# Patient Record
Sex: Male | Born: 1970 | Race: White | Hispanic: No | Marital: Married | Smoking: Never smoker
Health system: Southern US, Community
[De-identification: ages and names within clinical notes are randomized; demographics above are authoritative.]

## PROBLEM LIST (undated history)

## (undated) DIAGNOSIS — K219 Gastro-esophageal reflux disease without esophagitis: Secondary | ICD-10-CM

## (undated) DIAGNOSIS — E782 Mixed hyperlipidemia: Secondary | ICD-10-CM

## (undated) DIAGNOSIS — G4733 Obstructive sleep apnea (adult) (pediatric): Secondary | ICD-10-CM

## (undated) DIAGNOSIS — I10 Essential (primary) hypertension: Secondary | ICD-10-CM

## (undated) DIAGNOSIS — Z800 Family history of malignant neoplasm of digestive organs: Secondary | ICD-10-CM

## (undated) DIAGNOSIS — D229 Melanocytic nevi, unspecified: Secondary | ICD-10-CM

## (undated) DIAGNOSIS — K635 Polyp of colon: Secondary | ICD-10-CM

## (undated) DIAGNOSIS — G4730 Sleep apnea, unspecified: Secondary | ICD-10-CM

## (undated) HISTORY — DX: Sleep apnea, unspecified: G47.30

## (undated) HISTORY — PX: WISDOM TOOTH EXTRACTION: SHX21

---
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
## 2007-04-17 DIAGNOSIS — D229 Melanocytic nevi, unspecified: Secondary | ICD-10-CM

## 2007-04-17 HISTORY — DX: Melanocytic nevi, unspecified: D22.9

## 2018-06-27 DIAGNOSIS — Z800 Family history of malignant neoplasm of digestive organs: Secondary | ICD-10-CM | POA: Diagnosis not present

## 2018-06-27 DIAGNOSIS — E7800 Pure hypercholesterolemia, unspecified: Secondary | ICD-10-CM | POA: Diagnosis not present

## 2018-06-27 DIAGNOSIS — Z1331 Encounter for screening for depression: Secondary | ICD-10-CM | POA: Diagnosis not present

## 2018-06-27 DIAGNOSIS — I10 Essential (primary) hypertension: Secondary | ICD-10-CM | POA: Diagnosis not present

## 2018-09-19 DIAGNOSIS — D485 Neoplasm of uncertain behavior of skin: Secondary | ICD-10-CM | POA: Diagnosis not present

## 2018-09-19 DIAGNOSIS — L910 Hypertrophic scar: Secondary | ICD-10-CM | POA: Diagnosis not present

## 2018-09-19 DIAGNOSIS — D229 Melanocytic nevi, unspecified: Secondary | ICD-10-CM | POA: Diagnosis not present

## 2018-11-04 ENCOUNTER — Telehealth: Payer: Self-pay | Admitting: Gastroenterology

## 2018-11-04 NOTE — Telephone Encounter (Signed)
Dr. Tarri Glenn, pt requested you as a GI MD.  Pt's previous colon and path reports from 2013 will be sent to you for review.  Please advise scheduling.

## 2018-12-01 NOTE — Telephone Encounter (Signed)
Please check on status of records.  Pt requested an update.

## 2018-12-02 NOTE — Telephone Encounter (Signed)
Ok to schedule looks like records were scanned into chart under media. Thanks!

## 2018-12-16 ENCOUNTER — Other Ambulatory Visit: Payer: Self-pay

## 2018-12-16 ENCOUNTER — Other Ambulatory Visit: Payer: Self-pay | Admitting: *Deleted

## 2018-12-16 ENCOUNTER — Encounter: Payer: Self-pay | Admitting: Family Medicine

## 2018-12-16 ENCOUNTER — Ambulatory Visit (INDEPENDENT_AMBULATORY_CARE_PROVIDER_SITE_OTHER): Payer: BC Managed Care – PPO | Admitting: Family Medicine

## 2018-12-16 VITALS — BP 138/82 | HR 80 | Temp 97.6°F | Resp 16 | Ht 71.0 in | Wt 260.8 lb

## 2018-12-16 DIAGNOSIS — Z23 Encounter for immunization: Secondary | ICD-10-CM

## 2018-12-16 DIAGNOSIS — Z800 Family history of malignant neoplasm of digestive organs: Secondary | ICD-10-CM | POA: Diagnosis not present

## 2018-12-16 DIAGNOSIS — Z9989 Dependence on other enabling machines and devices: Secondary | ICD-10-CM

## 2018-12-16 DIAGNOSIS — E782 Mixed hyperlipidemia: Secondary | ICD-10-CM

## 2018-12-16 DIAGNOSIS — D369 Benign neoplasm, unspecified site: Secondary | ICD-10-CM | POA: Insufficient documentation

## 2018-12-16 DIAGNOSIS — E669 Obesity, unspecified: Secondary | ICD-10-CM

## 2018-12-16 DIAGNOSIS — K635 Polyp of colon: Secondary | ICD-10-CM

## 2018-12-16 DIAGNOSIS — K219 Gastro-esophageal reflux disease without esophagitis: Secondary | ICD-10-CM | POA: Insufficient documentation

## 2018-12-16 DIAGNOSIS — N529 Male erectile dysfunction, unspecified: Secondary | ICD-10-CM | POA: Insufficient documentation

## 2018-12-16 DIAGNOSIS — I10 Essential (primary) hypertension: Secondary | ICD-10-CM

## 2018-12-16 DIAGNOSIS — G4733 Obstructive sleep apnea (adult) (pediatric): Secondary | ICD-10-CM | POA: Diagnosis not present

## 2018-12-16 HISTORY — DX: Essential (primary) hypertension: I10

## 2018-12-16 HISTORY — DX: Obstructive sleep apnea (adult) (pediatric): G47.33

## 2018-12-16 HISTORY — DX: Family history of malignant neoplasm of digestive organs: Z80.0

## 2018-12-16 HISTORY — DX: Polyp of colon: K63.5

## 2018-12-16 HISTORY — DX: Mixed hyperlipidemia: E78.2

## 2018-12-16 HISTORY — DX: Gastro-esophageal reflux disease without esophagitis: K21.9

## 2018-12-16 NOTE — Patient Instructions (Signed)
Please return in 6 months for follow up of your hypertension and for your annual complete physical; please come fasting.  Today you were given your flu vaccination.   It was a pleasure meeting you today! Thank you for choosing [place] to meet your healthcare needs! I truly look forward to working with you. If you have any questions or concerns, please send me a message via Mychart or call the office at [phone].   Hypertension, Adult High blood pressure (hypertension) is when the force of blood pumping through the arteries is too strong. The arteries are the blood vessels that carry blood from the heart throughout the body. Hypertension forces the heart to work harder to pump blood and may cause arteries to become narrow or stiff. Untreated or uncontrolled hypertension can cause a heart attack, heart failure, a stroke, kidney disease, and other problems. A blood pressure reading consists of a higher number over a lower number. Ideally, your blood pressure should be below 120/80. The first ("top") number is called the systolic pressure. It is a measure of the pressure in your arteries as your heart beats. The second ("bottom") number is called the diastolic pressure. It is a measure of the pressure in your arteries as the heart relaxes. What are the causes? The exact cause of this condition is not known. There are some conditions that result in or are related to high blood pressure. What increases the risk? Some risk factors for high blood pressure are under your control. The following factors may make you more likely to develop this condition:  Smoking.  Having type 2 diabetes mellitus, high cholesterol, or both.  Not getting enough exercise or physical activity.  Being overweight.  Having too much fat, sugar, calories, or salt (sodium) in your diet.  Drinking too much alcohol. Some risk factors for high blood pressure may be difficult or impossible to change. Some of these factors include:   Having chronic kidney disease.  Having a family history of high blood pressure.  Age. Risk increases with age.  Race. You may be at higher risk if you are African American.  Gender. Men are at higher risk than women before age 63. After age 62, women are at higher risk than men.  Having obstructive sleep apnea.  Stress. What are the signs or symptoms? High blood pressure may not cause symptoms. Very high blood pressure (hypertensive crisis) may cause:  Headache.  Anxiety.  Shortness of breath.  Nosebleed.  Nausea and vomiting.  Vision changes.  Severe chest pain.  Seizures. How is this diagnosed? This condition is diagnosed by measuring your blood pressure while you are seated, with your arm resting on a flat surface, your legs uncrossed, and your feet flat on the floor. The cuff of the blood pressure monitor will be placed directly against the skin of your upper arm at the level of your heart. It should be measured at least twice using the same arm. Certain conditions can cause a difference in blood pressure between your right and left arms. Certain factors can cause blood pressure readings to be lower or higher than normal for a short period of time:  When your blood pressure is higher when you are in a health care provider's office than when you are at home, this is called white coat hypertension. Most people with this condition do not need medicines.  When your blood pressure is higher at home than when you are in a health care provider's office, this is called  masked hypertension. Most people with this condition may need medicines to control blood pressure. If you have a high blood pressure reading during one visit or you have normal blood pressure with other risk factors, you may be asked to:  Return on a different day to have your blood pressure checked again.  Monitor your blood pressure at home for 1 week or longer. If you are diagnosed with hypertension, you  may have other blood or imaging tests to help your health care provider understand your overall risk for other conditions. How is this treated? This condition is treated by making healthy lifestyle changes, such as eating healthy foods, exercising more, and reducing your alcohol intake. Your health care provider may prescribe medicine if lifestyle changes are not enough to get your blood pressure under control, and if:  Your systolic blood pressure is above 130.  Your diastolic blood pressure is above 80. Your personal target blood pressure may vary depending on your medical conditions, your age, and other factors. Follow these instructions at home: Eating and drinking   Eat a diet that is high in fiber and potassium, and low in sodium, added sugar, and fat. An example eating plan is called the DASH (Dietary Approaches to Stop Hypertension) diet. To eat this way: ? Eat plenty of fresh fruits and vegetables. Try to fill one half of your plate at each meal with fruits and vegetables. ? Eat whole grains, such as whole-wheat pasta, brown rice, or whole-grain bread. Fill about one fourth of your plate with whole grains. ? Eat or drink low-fat dairy products, such as skim milk or low-fat yogurt. ? Avoid fatty cuts of meat, processed or cured meats, and poultry with skin. Fill about one fourth of your plate with lean proteins, such as fish, chicken without skin, beans, eggs, or tofu. ? Avoid pre-made and processed foods. These tend to be higher in sodium, added sugar, and fat.  Reduce your daily sodium intake. Most people with hypertension should eat less than 1,500 mg of sodium a day.  Do not drink alcohol if: ? Your health care provider tells you not to drink. ? You are pregnant, may be pregnant, or are planning to become pregnant.  If you drink alcohol: ? Limit how much you use to:  0-1 drink a day for women.  0-2 drinks a day for men. ? Be aware of how much alcohol is in your drink. In  the [place] one drink equals one 12 oz bottle of beer (355 mL), one 5 oz glass of wine (148 mL), or one 1 oz glass of hard liquor (44 mL). Lifestyle   Work with your health care provider to maintain a healthy body weight or to lose weight. Ask what an ideal weight is for you.  Get at least 30 minutes of exercise most days of the week. Activities may include walking, swimming, or biking.  Include exercise to strengthen your muscles (resistance exercise), such as Pilates or lifting weights, as part of your weekly exercise routine. Try to do these types of exercises for 30 minutes at least 3 days a week.  Do not use any products that contain nicotine or tobacco, such as cigarettes, e-cigarettes, and chewing tobacco. If you need help quitting, ask your health care provider.  Monitor your blood pressure at home as told by your health care provider.  Keep all follow-up visits as told by your health care provider. This is important. Medicines  Take over-the-counter and prescription medicines only as  told by your health care provider. Follow directions carefully. Blood pressure medicines must be taken as prescribed.  Do not skip doses of blood pressure medicine. Doing this puts you at risk for problems and can make the medicine less effective.  Ask your health care provider about side effects or reactions to medicines that you should watch for. Contact a health care provider if you:  Think you are having a reaction to a medicine you are taking.  Have headaches that keep coming back (recurring).  Feel dizzy.  Have swelling in your ankles.  Have trouble with your vision. Get help right away if you:  Develop a severe headache or confusion.  Have unusual weakness or numbness.  Feel faint.  Have severe pain in your chest or abdomen.  Vomit repeatedly.  Have trouble breathing. Summary  Hypertension is when the force of blood pumping through your arteries is too strong. If this  condition is not controlled, it may put you at risk for serious complications.  Your personal target blood pressure may vary depending on your medical conditions, your age, and other factors. For most people, a normal blood pressure is less than 120/80.  Hypertension is treated with lifestyle changes, medicines, or a combination of both. Lifestyle changes include losing weight, eating a healthy, low-sodium diet, exercising more, and limiting alcohol. This information is not intended to replace advice given to you by your health care provider. Make sure you discuss any questions you have with your health care provider. Document Released: 04/02/2005 Document Revised: 12/11/2017 Document Reviewed: 12/11/2017 Elsevier Patient Education  2020 [employer].

## 2018-12-16 NOTE — Progress Notes (Signed)
Subjective  CC:  Chief Complaint  Patient presents with  . Establish Care  . Hypertension  . Hyperlipidemia    HPI: Manuel Wagner is a 48 y.o. male who presents to [facility] at [facility] today to establish care with me as a new patient.   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 Liking it. Likes the pace here in [place]. Wife is in hotel business. No children. Happy. Working on staying healthy. Has HTN, HLD, GERD, OSA on CPAP and is working on weight loss. He enjoys carbs. Now eating at home almost always so trying to do better. Some exercise as well.   Last cpe march 2020 from [facility]; reports stable labs.   fam h/o colon cancer in father age 81. Due for next colonoscopy. [facility] appt to be set up.   Strong FH of HTN and HLD. Father had CAD/CABG in 25s.  No new concerns today.   Assessment  1. Essential hypertension   2. Family history of colon cancer   3. Mixed hyperlipidemia   4. OSA on CPAP   5. Obesity (BMI 30-39.9)      Plan   HTN / HLD controlled. Will monitor bp. Would prefer bp a little lower given strong FH of htn and CAD. Will adjust meds at next visit if not at goal. Get labs from [facility].   Colonoscopy to be scheduled.   Continue cpap.   Counseled on dietary changes that would be beneficial for him.   Flu shot today.   Follow up:  Return in about 6 months (around 06/15/2019) for complete physical, follow up Hypertension. No orders of the defined types were placed in this encounter.  No orders of the defined types were placed in this encounter.    Depression screen PHQ 2/9 12/16/2018  Decreased Interest 0  Down, Depressed, Hopeless 0  PHQ - 2 Score 0    We updated and reviewed the patient's past history in detail and  it is documented below.  Patient Active Problem List   Diagnosis Date Noted  . Family history of colon cancer 12/16/2018  . Colon polyp 12/16/2018    Colonoscopy 2013   . Mixed hyperlipidemia 12/16/2018  . Essential hypertension 12/16/2018  . ED (erectile dysfunction) 12/16/2018  . GERD (gastroesophageal reflux disease) 12/16/2018  . OSA on CPAP 12/16/2018    Since 2012   . Obesity (BMI 30-39.9) 12/16/2018   Health Maintenance  Topic Date Due  . HIV Screening  01/31/1986  . INFLUENZA VACCINE  11/15/2018  . TETANUS/TDAP  04/16/2026   Immunization History  Administered Date(s) Administered  . Influenza,inj,Quad PF,6+ Mos 02/16/2018   Current Meds  Medication Sig  . amLODipine (NORVASC) 5 MG tablet Take 1 tablet by mouth daily.  Marland Kitchen losartan (COZAAR) 100 MG tablet Take 1 tablet by mouth daily.  . pravastatin (PRAVACHOL) 10 MG tablet Take 1 tablet by mouth daily.  . sildenafil (VIAGRA) 50 MG tablet Take 50 mg by mouth daily as needed for erectile dysfunction.  . [DISCONTINUED] atorvastatin (LIPITOR) 10 MG tablet Take 1 tablet by mouth daily.    Allergies: Patient is allergic to lisinopril. Past Medical History Patient  has a past medical history of Atypical nevus (2009), Atypical nevus (2019), Colon polyp (12/16/2018),  Essential hypertension (12/16/2018), Family history of colon cancer (12/16/2018), GERD (gastroesophageal reflux disease) (12/16/2018), Mixed hyperlipidemia (12/16/2018), and OSA on CPAP (12/16/2018). Past Surgical History Patient  has no past surgical history on file. Family History: Patient family history includes Alcohol abuse in his brother, maternal grandfather, and paternal grandfather; Arthritis in his father; Colon cancer in his father; Hearing loss in his father; Heart disease in his father; Hyperlipidemia in his brother and mother; Hypertension in his brother and father. Social History:  Patient  reports that he has never smoked. He has never used smokeless tobacco.  He reports that he does not drink alcohol or use drugs.  Review of Systems: Constitutional: negative for fever or malaise Ophthalmic: negative for photophobia, double vision or loss of vision Cardiovascular: negative for chest pain, dyspnea on exertion, or new LE swelling Respiratory: negative for SOB or persistent cough Gastrointestinal: negative for abdominal pain, change in bowel habits or melena Genitourinary: negative for dysuria or gross hematuria Musculoskeletal: negative for new gait disturbance or muscular weakness Integumentary: negative for new or persistent rashes Neurological: negative for TIA or stroke symptoms Psychiatric: negative for SI or delusions Allergic/Immunologic: negative for hives  Patient Care Team    Relationship Specialty Notifications Start End  [name], MD PCP - General Family Medicine  12/16/18     Objective  Vitals: BP 138/82   Pulse 80   Temp 97.6 F (36.4 C) (Tympanic)   Resp 16   Ht 5\' 11"  (1.803 m)   Wt 260 lb 12.8 oz (118.3 kg)   SpO2 98%   BMI 36.37 kg/m  General:  Well developed, well nourished, no acute distress  Psych:  Alert and oriented,normal mood and affect HEENT:  Normocephalic, atraumatic, non-icteric sclera, PERRL, oropharynx is without mass or exudate, supple neck without adenopathy, mass or thyromegaly Cardiovascular:  RRR without gallop, rub or murmur, nondisplaced PMI Respiratory:  Good breath sounds bilaterally, CTAB with normal respiratory effort Gastrointestinal: normal bowel sounds, soft, non-tender, no noted masses. No HSM MSK: no deformities, contusions. Joints are without erythema or swelling Skin:  Warm, no rashes or suspicious lesions noted Neurologic:    Mental status is normal. Gross motor and sensory exams are normal. Normal gait   Commons side effects, risks, benefits, and alternatives for medications and treatment plan prescribed today were discussed, and the patient expressed understanding of the given  instructions. Patient is instructed to call or message via MyChart if he/she has any questions or concerns regarding our treatment plan. No barriers to understanding were identified. We discussed Red Flag symptoms and signs in detail. Patient expressed understanding regarding what to do in case of urgent or emergency type symptoms.   Medication list was reconciled, printed and provided to the patient in AVS. Patient instructions and summary information was reviewed with the patient as documented in the AVS. This note was prepared with assistance of Dragon voice recognition software. Occasional wrong-word or sound-a-like substitutions may have occurred due to the inherent limitations of voice recognition software

## 2019-02-13 ENCOUNTER — Encounter: Payer: Self-pay | Admitting: Gastroenterology

## 2019-03-05 ENCOUNTER — Ambulatory Visit (AMBULATORY_SURGERY_CENTER): Payer: BC Managed Care – PPO | Admitting: *Deleted

## 2019-03-05 ENCOUNTER — Encounter: Payer: Self-pay | Admitting: Gastroenterology

## 2019-03-05 ENCOUNTER — Other Ambulatory Visit: Payer: Self-pay

## 2019-03-05 VITALS — Temp 97.6°F | Ht 71.0 in | Wt 265.0 lb

## 2019-03-05 DIAGNOSIS — Z800 Family history of malignant neoplasm of digestive organs: Secondary | ICD-10-CM

## 2019-03-05 DIAGNOSIS — Z1159 Encounter for screening for other viral diseases: Secondary | ICD-10-CM

## 2019-03-05 MED ORDER — NA SULFATE-K SULFATE-MG SULF 17.5-3.13-1.6 GM/177ML PO SOLN: 1.0000 | Freq: Once | ORAL | 0 refills | Status: AC

## 2019-03-05 NOTE — Progress Notes (Signed)

## 2019-03-16 ENCOUNTER — Other Ambulatory Visit: Payer: Self-pay | Admitting: Gastroenterology

## 2019-03-16 ENCOUNTER — Ambulatory Visit (INDEPENDENT_AMBULATORY_CARE_PROVIDER_SITE_OTHER): Payer: BC Managed Care – PPO

## 2019-03-16 DIAGNOSIS — Z1159 Encounter for screening for other viral diseases: Secondary | ICD-10-CM

## 2019-03-17 LAB — SARS CORONAVIRUS 2 (TAT 6-24 HRS): SARS Coronavirus 2: NEGATIVE

## 2019-03-19 ENCOUNTER — Ambulatory Visit (AMBULATORY_SURGERY_CENTER): Payer: BC Managed Care – PPO | Admitting: Gastroenterology

## 2019-03-19 ENCOUNTER — Other Ambulatory Visit: Payer: Self-pay

## 2019-03-19 ENCOUNTER — Encounter: Payer: Self-pay | Admitting: Gastroenterology

## 2019-03-19 VITALS — BP 135/78 | HR 68 | Temp 98.5°F | Resp 14 | Ht 71.0 in | Wt 256.0 lb

## 2019-03-19 DIAGNOSIS — Z800 Family history of malignant neoplasm of digestive organs: Secondary | ICD-10-CM | POA: Diagnosis not present

## 2019-03-19 DIAGNOSIS — Z1159 Encounter for screening for other viral diseases: Secondary | ICD-10-CM

## 2019-03-19 DIAGNOSIS — Z86010 Personal history of colonic polyps: Secondary | ICD-10-CM

## 2019-03-19 DIAGNOSIS — Z1211 Encounter for screening for malignant neoplasm of colon: Secondary | ICD-10-CM | POA: Diagnosis not present

## 2019-03-19 DIAGNOSIS — K529 Noninfective gastroenteritis and colitis, unspecified: Secondary | ICD-10-CM | POA: Diagnosis not present

## 2019-03-19 DIAGNOSIS — D123 Benign neoplasm of transverse colon: Secondary | ICD-10-CM | POA: Diagnosis not present

## 2019-03-19 MED ORDER — SODIUM CHLORIDE 0.9 % IV SOLN: 500.0000 mL | Freq: Once | INTRAVENOUS | Status: DC

## 2019-03-19 NOTE — Progress Notes (Signed)
Called to room to assist during endoscopic procedure.  Patient ID and intended procedure confirmed with present staff. Received instructions for my participation in the procedure from the performing physician.  

## 2019-03-19 NOTE — Progress Notes (Signed)
Reviewed HX (medical and surgical)- confirmed  Temp-LC  VS -CW

## 2019-03-19 NOTE — Patient Instructions (Signed)
Thank you for allowing [place] to care for you today!  Await pathology results by mail, approximately 2 weeks.  Will make recommendations at that time.  Resume previous medications today  Recommend adopting high fiber diet.  Handout provided.    Recommend next colonoscopy in 5 years for surveillance given family history.    YOU HAD AN ENDOSCOPIC PROCEDURE TODAY AT [facility]:   Refer to the procedure report that was given to you for any specific questions about what was found during the examination.  If the procedure report does not answer your questions, please call your gastroenterologist to clarify.  If you requested that your care partner not be given the details of your procedure findings, then the procedure report has been included in a sealed envelope for you to review at your convenience later.  YOU SHOULD EXPECT: Some feelings of bloating in the abdomen. Passage of more gas than usual.  Walking can help get rid of the air that was put into your GI tract during the procedure and reduce the bloating. If you had a lower endoscopy (such as a colonoscopy or flexible sigmoidoscopy) you may notice spotting of blood in your stool or on the toilet paper. If you underwent a bowel prep for your procedure, you may not have a normal bowel movement for a few days.  Please Note:  You might notice some irritation and congestion in your nose or some drainage.  This is from the oxygen used during your procedure.  There is no need for concern and it should clear up in a day or so.  SYMPTOMS TO REPORT IMMEDIATELY:   Following lower endoscopy (colonoscopy or flexible sigmoidoscopy):  Excessive amounts of blood in the stool  Significant tenderness or worsening of abdominal pains  Swelling of the abdomen that is new, acute  Fever of 100F or higher   For urgent or emergent issues, a gastroenterologist can be reached at any hour by calling [phone].   DIET:  We do recommend a small  meal at first, but then you may proceed to your regular diet.  Drink plenty of fluids but you should avoid alcoholic beverages for 24 hours.  ACTIVITY:  You should plan to take it easy for the rest of today and you should NOT DRIVE or use heavy machinery until tomorrow (because of the sedation medicines used during the test).    FOLLOW UP: Our staff will call the number listed on your records 48-72 hours following your procedure to check on you and address any questions or concerns that you may have regarding the information given to you following your procedure. If we do not reach you, we will leave a message.  We will attempt to reach you two times.  During this call, we will ask if you have developed any symptoms of COVID 19. If you develop any symptoms (ie: fever, flu-like symptoms, shortness of breath, cough etc.) before then, please call [phone].  If you test positive for Covid 19 in the 2 weeks post procedure, please call and report this information to [place].    If any biopsies were taken you will be contacted by phone or by letter within the next 1-3 weeks.  Please call us at [phone] if you have not heard about the biopsies in 3 weeks.    SIGNATURES/CONFIDENTIALITY: You and/or your care partner have signed paperwork which will be entered into your electronic medical record.  These signatures attest to the fact that that  the information above on your After Visit Summary has been reviewed and is understood.  Full responsibility of the confidentiality of this discharge information lies with you and/or your care-partner.

## 2019-03-19 NOTE — Progress Notes (Signed)
A/ox3, pleased with MAC, report to RN 

## 2019-03-19 NOTE — Op Note (Signed)
Manuel Wagner Patient Name: Manuel Wagner Procedure Date: 03/19/2019 11:30 AM MRN: [id number] Endoscopist: Thornton Park MD, MD Age: 48 Referring MD:  Date of Birth: May 09, 1970 Gender: Male Account #: [id number] Procedure:                Colonoscopy Indications:              Screening in patient at increased risk: Family                            history of 1st-degree relative with colorectal                            cancer around age 37 years                           Father with colon cancer around age 27                           Colonoscopy 05/23/2005 with Dr. Haze Rushing in [place],                            [place]: internal and external hemorrhoids; normal                           Colonoscopy 01/16/2012 with Dr. Elicia Lamp in                            [place], [place] for a family history of colon                            cancer revealed a 66mm rectal polyp. Path not                            available. Medicines:                Monitored Anesthesia Care Procedure:                Pre-Anesthesia Assessment:                           - Prior to the procedure, a History and Physical                            was performed, and patient medications and                            allergies were reviewed. The patient's tolerance of                            previous anesthesia was also reviewed. The risks                            and benefits of the procedure and the sedation  options and risks were discussed with the patient.                            All questions were answered, and informed consent                            was obtained. Prior Anticoagulants: The patient has                            taken no previous anticoagulant or antiplatelet                            agents. ASA Grade Assessment: III - A patient with                            severe systemic disease. After reviewing the risks                            and benefits,  the patient was deemed in                            satisfactory condition to undergo the procedure.                           After obtaining informed consent, the colonoscope                            was passed under direct vision. Throughout the                            procedure, the patient's blood pressure, pulse, and                            oxygen saturations were monitored continuously. The                            Colonoscope was introduced through the anus and                            advanced to the the terminal ileum, with                            identification of the appendiceal orifice and IC                            valve. A second forward view of the right colon was                            performed. The colonoscopy was performed without                            difficulty. The patient tolerated the procedure  well. The quality of the bowel preparation was                            good. The terminal ileum, ileocecal valve,                            appendiceal orifice, and rectum were photographed. Scope In: 11:40:43 AM Scope Out: 11:57:16 AM Scope Withdrawal Time: 0 hours 13 minutes 53 seconds  Total Procedure Duration: 0 hours 16 minutes 33 seconds  Findings:                 The perianal and digital rectal examinations were                            normal.                           A few small and large-mouthed diverticula were                            found in the sigmoid colon, descending colon and                            ascending colon.                           A 1 mm polyp was found in the proximal transverse                            colon. The polyp was sessile. The polyp was removed                            with a cold biopsy forceps. Resection and retrieval                            were complete. Estimated blood loss was minimal.                           A patchy area of mildly erythematous mucosa  was                            found in the sigmoid colon. A more diffuse area of                            erythematous mucous was seen from 50-80cm in the                            transverse and distal descending colon. Unclear if                            this was clinically significance or prep artifact.                            Biopsies were taken with a cold  forceps for                            histology from the erythema of erythema as well as                            from the rectum. Estimated blood loss was minimal.                           Non-bleeding internal hemorrhoids were found. Complications:            No immediate complications. Estimated blood loss:                            Minimal. Estimated Blood Loss:     Estimated blood loss was minimal. Impression:               - Diverticulosis in the sigmoid colon, in the                            descending colon and in the ascending colon.                           - One 1 mm polyp in the proximal transverse colon,                            removed with a cold biopsy forceps. Resected and                            retrieved.                           - Erythematous mucosa in the sigmoid colon,                            descending colon, and transverse colon. Biopsied.                           - Non-bleeding internal hemorrhoids. Recommendation:           - Patient has a contact number available for                            emergencies. The signs and symptoms of potential                            delayed complications were discussed with the                            patient. Return to normal activities tomorrow.                            Written discharge instructions were provided to the                            patient.                           -  High fiber diet.                           - Continue present medications.                           - Await pathology results.                            - Repeat colonoscopy in 5 years for surveillance                            given the family history of colon cancer. Thornton Park MD, MD 03/19/2019 12:08:07 PM This report has been signed electronically.

## 2019-03-20 DIAGNOSIS — L820 Inflamed seborrheic keratosis: Secondary | ICD-10-CM | POA: Diagnosis not present

## 2019-03-20 DIAGNOSIS — B070 Plantar wart: Secondary | ICD-10-CM | POA: Diagnosis not present

## 2019-03-20 DIAGNOSIS — L814 Other melanin hyperpigmentation: Secondary | ICD-10-CM | POA: Diagnosis not present

## 2019-03-23 ENCOUNTER — Telehealth: Payer: Self-pay

## 2019-03-23 NOTE — Telephone Encounter (Signed)
  Follow up Call-  Call back number 03/19/2019  Post procedure Call Back phone  # [phone]  Permission to leave phone message Yes  Some recent data might be hidden     Patient questions:  Do you have a fever, pain , or abdominal swelling? No. Pain Score  0 *  Have you tolerated food without any problems? Yes.    Have you been able to return to your normal activities? Yes.    Do you have any questions about your discharge instructions: Diet   No. Medications  No. Follow up visit  No.  Do you have questions or concerns about your Care? No.  Actions: * If pain score is 4 or above: 1. No action needed, pain <4.Have you developed a fever since your procedure? no  2.   Have you had an respiratory symptoms (SOB or cough) since your procedure? no  3.   Have you tested positive for COVID 19 since your procedure no  4.   Have you had any family members/close contacts diagnosed with the COVID 19 since your procedure?  no   If yes to any of these questions please route to Joylene John, RN and Alphonsa Gin, Therapist, sports.

## 2019-03-30 ENCOUNTER — Encounter: Payer: Self-pay | Admitting: Family Medicine

## 2019-03-30 ENCOUNTER — Telehealth: Payer: Self-pay | Admitting: Gastroenterology

## 2019-03-30 NOTE — Telephone Encounter (Signed)
See result note.  

## 2019-05-23 ENCOUNTER — Other Ambulatory Visit: Payer: Self-pay | Admitting: Family Medicine

## 2019-05-23 ENCOUNTER — Encounter: Payer: Self-pay | Admitting: Family Medicine

## 2019-05-25 ENCOUNTER — Other Ambulatory Visit: Payer: Self-pay

## 2019-05-25 MED ORDER — SILDENAFIL CITRATE 50 MG PO TABS: 50.0000 mg | ORAL_TABLET | Freq: Every day | ORAL | 0 refills | Status: DC | PRN

## 2019-05-25 MED ORDER — LOSARTAN POTASSIUM 100 MG PO TABS: 100.0000 mg | ORAL_TABLET | Freq: Every day | ORAL | 3 refills | Status: DC

## 2019-05-25 MED ORDER — AMLODIPINE BESYLATE 5 MG PO TABS: 5.0000 mg | ORAL_TABLET | Freq: Every day | ORAL | 3 refills | Status: DC

## 2019-05-25 MED ORDER — PRAVASTATIN SODIUM 10 MG PO TABS: 10.0000 mg | ORAL_TABLET | Freq: Every day | ORAL | 3 refills | Status: DC

## 2019-06-16 ENCOUNTER — Other Ambulatory Visit: Payer: Self-pay

## 2019-06-16 ENCOUNTER — Encounter: Payer: Self-pay | Admitting: Family Medicine

## 2019-06-16 ENCOUNTER — Ambulatory Visit (INDEPENDENT_AMBULATORY_CARE_PROVIDER_SITE_OTHER): Payer: BC Managed Care – PPO | Admitting: Family Medicine

## 2019-06-16 VITALS — BP 154/88 | HR 83 | Temp 97.2°F | Ht 71.0 in | Wt 265.8 lb

## 2019-06-16 DIAGNOSIS — Z0000 Encounter for general adult medical examination without abnormal findings: Secondary | ICD-10-CM | POA: Diagnosis not present

## 2019-06-16 DIAGNOSIS — I10 Essential (primary) hypertension: Secondary | ICD-10-CM | POA: Diagnosis not present

## 2019-06-16 DIAGNOSIS — E669 Obesity, unspecified: Secondary | ICD-10-CM

## 2019-06-16 DIAGNOSIS — G4733 Obstructive sleep apnea (adult) (pediatric): Secondary | ICD-10-CM

## 2019-06-16 DIAGNOSIS — Z800 Family history of malignant neoplasm of digestive organs: Secondary | ICD-10-CM

## 2019-06-16 DIAGNOSIS — E782 Mixed hyperlipidemia: Secondary | ICD-10-CM | POA: Diagnosis not present

## 2019-06-16 DIAGNOSIS — K219 Gastro-esophageal reflux disease without esophagitis: Secondary | ICD-10-CM | POA: Diagnosis not present

## 2019-06-16 DIAGNOSIS — D369 Benign neoplasm, unspecified site: Secondary | ICD-10-CM

## 2019-06-16 DIAGNOSIS — Z9989 Dependence on other enabling machines and devices: Secondary | ICD-10-CM

## 2019-06-16 LAB — CBC WITH DIFFERENTIAL/PLATELET
Basophils Absolute: 0.1 10*3/uL (ref 0.0–0.1)
Basophils Relative: 1.1 % (ref 0.0–3.0)
Eosinophils Absolute: 0.1 10*3/uL (ref 0.0–0.7)
Eosinophils Relative: 1.6 % (ref 0.0–5.0)
HCT: 48.1 % (ref 39.0–52.0)
Hemoglobin: 16.9 g/dL (ref 13.0–17.0)
Lymphocytes Relative: 27.4 % (ref 12.0–46.0)
Lymphs Abs: 2.5 10*3/uL (ref 0.7–4.0)
MCHC: 35.1 g/dL (ref 30.0–36.0)
MCV: 82.2 fl (ref 78.0–100.0)
Monocytes Absolute: 0.4 10*3/uL (ref 0.1–1.0)
Monocytes Relative: 4.8 % (ref 3.0–12.0)
Neutro Abs: 5.9 10*3/uL (ref 1.4–7.7)
Neutrophils Relative %: 65.1 % (ref 43.0–77.0)
Platelets: 287 10*3/uL (ref 150.0–400.0)
RBC: 5.86 Mil/uL — ABNORMAL HIGH (ref 4.22–5.81)
RDW: 14.6 % (ref 11.5–15.5)
WBC: 9.1 10*3/uL (ref 4.0–10.5)

## 2019-06-16 LAB — COMPREHENSIVE METABOLIC PANEL
ALT: 21 U/L (ref 0–53)
AST: 18 U/L (ref 0–37)
Albumin: 4.3 g/dL (ref 3.5–5.2)
Alkaline Phosphatase: 64 U/L (ref 39–117)
BUN: 11 mg/dL (ref 6–23)
CO2: 24 mEq/L (ref 19–32)
Calcium: 9.7 mg/dL (ref 8.4–10.5)
Chloride: 104 mEq/L (ref 96–112)
Creatinine, Ser: 0.74 mg/dL (ref 0.40–1.50)
GFR: 112.71 mL/min (ref >60.00)
Glucose, Bld: 107 mg/dL — ABNORMAL HIGH (ref 70–99)
Potassium: 4.6 mEq/L (ref 3.5–5.1)
Sodium: 138 mEq/L (ref 135–145)
Total Bilirubin: 1 mg/dL (ref 0.2–1.2)
Total Protein: 7.4 g/dL (ref 6.0–8.3)

## 2019-06-16 LAB — LIPID PANEL
Cholesterol: 145 mg/dL (ref 0–200)
HDL: 32.9 mg/dL — ABNORMAL LOW (ref >39.00)
LDL Cholesterol: 89 mg/dL (ref 0–99)
NonHDL: 112.39
Total CHOL/HDL Ratio: 4
Triglycerides: 116 mg/dL (ref 0.0–149.0)
VLDL: 23.2 mg/dL (ref 0.0–40.0)

## 2019-06-16 LAB — POCT URINALYSIS DIPSTICK
Bilirubin, UA: NEGATIVE
Blood, UA: NEGATIVE
Glucose, UA: NEGATIVE
Ketones, UA: NEGATIVE
Leukocytes, UA: NEGATIVE
Nitrite, UA: NEGATIVE
Protein, UA: NEGATIVE
Spec Grav, UA: 1.015 (ref 1.010–1.025)
Urobilinogen, UA: 0.2 E.U./dL
pH, UA: 8 (ref 5.0–8.0)

## 2019-06-16 LAB — HEMOGLOBIN A1C: Hgb A1c MFr Bld: 5.1 % (ref 4.6–6.5)

## 2019-06-16 LAB — TSH: TSH: 1.95 u[IU]/mL (ref 0.35–4.50)

## 2019-06-16 MED ORDER — AMLODIPINE BESYLATE 5 MG PO TABS: 10.0000 mg | ORAL_TABLET | Freq: Every day | ORAL | 3 refills | Status: DC

## 2019-06-16 MED ORDER — LOSARTAN POTASSIUM-HCTZ 100-12.5 MG PO TABS: 1.0000 | ORAL_TABLET | Freq: Every day | ORAL | 3 refills | Status: DC

## 2019-06-16 NOTE — Patient Instructions (Addendum)
Please return in 6 months for follow up of your hypertension.  I will release your lab results to you on your MyChart account with further instructions. Please reply with any questions.   Your blood pressures are above goal consistently. Please increase your amlodipine to 10mg  daily (take 2 tablets together of the 5mg ). I will order the larger tablet for you when your next refill is due if you send me a request.  I have adjusted the losartan to losartan/hctz; these changes should help bring you blood pressures under better control. Let me know if you have any problems with the adjustments.  If you have any questions or concerns, please don't hesitate to send me a message via MyChart or call the office at [phone]. Thank you for visiting with Manuel Wagner today! It's our pleasure caring for you.  Please do these things to maintain good health!   Exercise at least 30-45 minutes a day,  4-5 days a week.   Eat a low-fat diet with lots of fruits and vegetables, up to 7-9 servings per day.  Drink plenty of water daily. Try to drink 8 8oz glasses per day.  Seatbelts can save your life. Always wear your seatbelt.  Place Smoke Detectors on every level of your home and check batteries every year.  Eye Doctor - have an eye exam every 1-2 years  Safe sex - use condoms to protect yourself from STDs if you could be exposed to these types of infections.  Avoid heavy alcohol use. If you drink, keep it to less than 2 drinks/day and not every day.  [place].  Choose someone you trust that could speak for you if you became unable to speak for yourself.  Depression is common in our stressful world.If you're feeling down or losing interest in things you normally enjoy, please come in for a visit.   Hypertension, Adult High blood pressure (hypertension) is when the force of blood pumping through the arteries is too strong. The arteries are the blood vessels that carry blood from the heart  throughout the body. Hypertension forces the heart to work harder to pump blood and may cause arteries to become narrow or stiff. Untreated or uncontrolled hypertension can cause a heart attack, heart failure, a stroke, kidney disease, and other problems. A blood pressure reading consists of a higher number over a lower number. Ideally, your blood pressure should be below 120/80. The first ("top") number is called the systolic pressure. It is a measure of the pressure in your arteries as your heart beats. The second ("bottom") number is called the diastolic pressure. It is a measure of the pressure in your arteries as the heart relaxes. What are the causes? The exact cause of this condition is not known. There are some conditions that result in or are related to high blood pressure. What increases the risk? Some risk factors for high blood pressure are under your control. The following factors may make you more likely to develop this condition:  Smoking.  Having type 2 diabetes mellitus, high cholesterol, or both.  Not getting enough exercise or physical activity.  Being overweight.  Having too much fat, sugar, calories, or salt (sodium) in your diet.  Drinking too much alcohol. Some risk factors for high blood pressure may be difficult or impossible to change. Some of these factors include:  Having chronic kidney disease.  Having a family history of high blood pressure.  Age. Risk increases with age.  Race. You  may be at higher risk if you are African American.  Gender. Men are at higher risk than women before age 28. After age 67, women are at higher risk than men.  Having obstructive sleep apnea.  Stress. What are the signs or symptoms? High blood pressure may not cause symptoms. Very high blood pressure (hypertensive crisis) may cause:  Headache.  Anxiety.  Shortness of breath.  Nosebleed.  Nausea and vomiting.  Vision changes.  Severe chest pain.  Seizures. How  is this diagnosed? This condition is diagnosed by measuring your blood pressure while you are seated, with your arm resting on a flat surface, your legs uncrossed, and your feet flat on the floor. The cuff of the blood pressure monitor will be placed directly against the skin of your upper arm at the level of your heart. It should be measured at least twice using the same arm. Certain conditions can cause a difference in blood pressure between your right and left arms. Certain factors can cause blood pressure readings to be lower or higher than normal for a short period of time:  When your blood pressure is higher when you are in a health care provider's office than when you are at home, this is called white coat hypertension. Most people with this condition do not need medicines.  When your blood pressure is higher at home than when you are in a health care provider's office, this is called masked hypertension. Most people with this condition may need medicines to control blood pressure. If you have a high blood pressure reading during one visit or you have normal blood pressure with other risk factors, you may be asked to:  Return on a different day to have your blood pressure checked again.  Monitor your blood pressure at home for 1 week or longer. If you are diagnosed with hypertension, you may have other blood or imaging tests to help your health care provider understand your overall risk for other conditions. How is this treated? This condition is treated by making healthy lifestyle changes, such as eating healthy foods, exercising more, and reducing your alcohol intake. Your health care provider may prescribe medicine if lifestyle changes are not enough to get your blood pressure under control, and if:  Your systolic blood pressure is above 130.  Your diastolic blood pressure is above 80. Your personal target blood pressure may vary depending on your medical conditions, your age, and other  factors. Follow these instructions at home: Eating and drinking   Eat a diet that is high in fiber and potassium, and low in sodium, added sugar, and fat. An example eating plan is called the DASH (Dietary Approaches to Stop Hypertension) diet. To eat this way: ? Eat plenty of fresh fruits and vegetables. Try to fill one half of your plate at each meal with fruits and vegetables. ? Eat whole grains, such as whole-wheat pasta, brown rice, or whole-grain bread. Fill about one fourth of your plate with whole grains. ? Eat or drink low-fat dairy products, such as skim milk or low-fat yogurt. ? Avoid fatty cuts of meat, processed or cured meats, and poultry with skin. Fill about one fourth of your plate with lean proteins, such as fish, chicken without skin, beans, eggs, or tofu. ? Avoid pre-made and processed foods. These tend to be higher in sodium, added sugar, and fat.  Reduce your daily sodium intake. Most people with hypertension should eat less than 1,500 mg of sodium a day.  Do  not drink alcohol if: ? Your health care provider tells you not to drink. ? You are pregnant, may be pregnant, or are planning to become pregnant.  If you drink alcohol: ? Limit how much you use to:  0-1 drink a day for women.  0-2 drinks a day for men. ? Be aware of how much alcohol is in your drink. In the [place] one drink equals one 12 oz bottle of beer (355 mL), one 5 oz glass of wine (148 mL), or one 1 oz glass of hard liquor (44 mL). Lifestyle   Work with your health care provider to maintain a healthy body weight or to lose weight. Ask what an ideal weight is for you.  Get at least 30 minutes of exercise most days of the week. Activities may include walking, swimming, or biking.  Include exercise to strengthen your muscles (resistance exercise), such as Pilates or lifting weights, as part of your weekly exercise routine. Try to do these types of exercises for 30 minutes at least 3 days a week.  Do  not use any products that contain nicotine or tobacco, such as cigarettes, e-cigarettes, and chewing tobacco. If you need help quitting, ask your health care provider.  Monitor your blood pressure at home as told by your health care provider.  Keep all follow-up visits as told by your health care provider. This is important. Medicines  Take over-the-counter and prescription medicines only as told by your health care provider. Follow directions carefully. Blood pressure medicines must be taken as prescribed.  Do not skip doses of blood pressure medicine. Doing this puts you at risk for problems and can make the medicine less effective.  Ask your health care provider about side effects or reactions to medicines that you should watch for. Contact a health care provider if you:  Think you are having a reaction to a medicine you are taking.  Have headaches that keep coming back (recurring).  Feel dizzy.  Have swelling in your ankles.  Have trouble with your vision. Get help right away if you:  Develop a severe headache or confusion.  Have unusual weakness or numbness.  Feel faint.  Have severe pain in your chest or abdomen.  Vomit repeatedly.  Have trouble breathing. Summary  Hypertension is when the force of blood pumping through your arteries is too strong. If this condition is not controlled, it may put you at risk for serious complications.  Your personal target blood pressure may vary depending on your medical conditions, your age, and other factors. For most people, a normal blood pressure is less than 120/80.  Hypertension is treated with lifestyle changes, medicines, or a combination of both. Lifestyle changes include losing weight, eating a healthy, low-sodium diet, exercising more, and limiting alcohol. This information is not intended to replace advice given to you by your health care provider. Make sure you discuss any questions you have with your health care  provider. Document Revised: 12/11/2017 Document Reviewed: 12/11/2017 Elsevier Patient Education  2020 [employer].

## 2019-06-16 NOTE — Progress Notes (Signed)
Subjective  Chief Complaint  Patient presents with  . Annual Exam    fasting now new concerns today  . Hypertension    has not checked bp readings at home. No HA, dizziness, or visual changes  . Hyperlipidemia    Diet is good. Exercise is moderate    HPI: Manuel Wagner is a 49 y.o. male who presents to [facility] [place] today for a Male Wellness Visit. He also has the concerns and/or needs as listed above in the chief complaint. These will be addressed in addition to the Health Maintenance Visit.   Wellness Visit: annual visit with health maintenance review and exam    HM: CRC screen done recently. Eating well an d Lifestyle: Body mass index is 37.07 kg/m. Wt Readings from Last 3 Encounters:  06/16/19 265 lb 12.8 oz (120.6 kg)  03/19/19 256 lb (116.1 kg)  03/05/19 265 lb (120.2 kg)    Chronic disease management visit and/or acute problem visit:  Hypertension f/u: Control is reported as good . Pt reports he is doing well. taking medications as instructed, no medication side effects noted, no TIAs, no chest pain on exertion, no dyspnea on exertion, no swelling of ankles. However, elevated readings persist by chart review. Had elevated readings during recent colonoscopy.  He denies adverse effects from his BP medications. Compliance with medication is good.   HLD on statin, low intensity. For recheck today. Eating well. Weight is up but exercising more  Obesity: increased mm mass but would like to lose weight as well. Has tried low carb diet w/o much success  fam h/o colon cancer and recent colonoscopy with adenomatous polyps reviewed.   ED: does well with prn viagra  Gerd: rare sxs now.   OSA on cpap: reports effective. Uses nightly. No conerns  BP Readings from Last 3 Encounters:  06/16/19 (!) 154/88  03/19/19 135/78  12/16/18 138/82   Wt Readings from Last 3 Encounters:  06/16/19 265 lb 12.8 oz (120.6 kg)  03/19/19 256 lb (116.1 kg)  03/05/19  265 lb (120.2 kg)     Patient Active Problem List   Diagnosis Date Noted  . Family history of colon cancer 12/16/2018  . Adenomatous polyp 12/16/2018  . Mixed hyperlipidemia 12/16/2018  . Essential hypertension 12/16/2018  . OSA on CPAP 12/16/2018  . Obesity (BMI 30-39.9) 12/16/2018  . GERD (gastroesophageal reflux disease) 12/16/2018  . ED (erectile dysfunction) 12/16/2018   Health Maintenance  Topic Date Due  . HIV Screening  01/31/1986  . COLONOSCOPY  03/18/2024  . TETANUS/TDAP  10/20/2026  . INFLUENZA VACCINE  Completed   Immunization History  Administered Date(s) Administered  . Hepatitis A, Adult 01/20/2004, 07/07/2015  . Hepatitis B, adult 08/09/2005, 09/11/2005, 03/05/2006  . Influenza Split 03/05/2006, 04/03/2007, 01/07/2008, 03/25/2009, 01/27/2010, 01/17/2011, 03/20/2012  . Influenza,inj,Quad PF,6+ Mos 02/16/2018, 12/16/2018  . Influenza,inj,quad, With Preservative 04/20/2013, 01/15/2014, 01/29/2015, 01/13/2016, 02/16/2018  . Tdap 04/03/2007, 10/19/2016   We updated and reviewed the patient's past history in detail and it is documented below. Allergies: Patient is allergic to lisinopril. Past Medical History  has a past medical history of Atypical nevus (2009), Atypical nevus (2019), Colon polyp (12/16/2018), Essential hypertension (12/16/2018), Family history of colon cancer (12/16/2018), GERD (gastroesophageal reflux disease) (12/16/2018), Mixed hyperlipidemia (12/16/2018), and OSA on CPAP (12/16/2018). Past Surgical History Patient  has no past surgical history on file. Social History Patient  reports that he has never smoked. He has never used smokeless tobacco. He reports current  alcohol use. He reports that he does not use drugs. Family History family history includes Alcohol abuse in his brother, maternal grandfather, and paternal grandfather; Arthritis in his father; Colon cancer in his father; Hearing loss in his father; Heart disease in his father; Hyperlipidemia in  his brother and mother; Hypertension in his brother and father. Review of Systems: Constitutional: negative for fever or malaise Ophthalmic: negative for photophobia, double vision or loss of vision Cardiovascular: negative for chest pain, dyspnea on exertion, or new LE swelling Respiratory: negative for SOB or persistent cough Gastrointestinal: negative for abdominal pain, change in bowel habits or melena Genitourinary: negative for dysuria or gross hematuria Musculoskeletal: negative for new gait disturbance or muscular weakness Integumentary: negative for new or persistent rashes Neurological: negative for TIA or stroke symptoms Psychiatric: negative for SI or delusions Allergic/Immunologic: negative for hives  Patient Care Team    Relationship Specialty Notifications Start End  [name], MD PCP - General Family Medicine  12/16/18   Thornton Park, MD Consulting Physician Gastroenterology  03/30/19    Objective  Vitals: BP (!) 154/88 (BP Location: Left Arm, Patient Position: Sitting, Cuff Size: Large)   Pulse 83   Temp (!) 97.2 F (36.2 C) (Temporal)   Ht 5\' 11"  (1.803 m)   Wt 265 lb 12.8 oz (120.6 kg)   SpO2 96%   BMI 37.07 kg/m  General:  Well developed, well nourished, no acute distress  Psych:  Alert and orientedx3,normal mood and affect HEENT:  Normocephalic, atraumatic, non-icteric sclera, PERRL,supple neck without adenopathy, mass or thyromegaly Cardiovascular:  Normal S1, S2, RRR without gallop, rub or murmur, non+2 distal pulses in bilateral upper and lower extremities. Respiratory:  Good breath sounds bilaterally, CTAB with normal respiratory effort Gastrointestinal: normal bowel sounds, soft, non-tender, no noted masses. No HSM MSK: no deformities, contusions. Joints are without erythema or swelling. Spine and CVA region are nontender Skin:  Warm, no rashes or suspicious lesions noted Neurologic:    Mental status is normal. CN 2-11 are normal. Gross motor  and sensory exams are normal. Stable gait. No tremor GU: No inguinal hernias or adenopathy are appreciated bilaterally   Assessment  1. Annual physical exam   2. Essential hypertension   3. Mixed hyperlipidemia   4. Gastroesophageal reflux disease, unspecified whether esophagitis present   5. OSA on CPAP   6. Adenomatous polyp   7. Family history of colon cancer   8. Obesity (BMI 30-39.9)      Plan  Male Wellness Visit:  Age appropriate Health Maintenance and Prevention measures were discussed with patient. Included topics are cancer screening recommendations, ways to keep healthy (see AVS) including dietary and exercise recommendations, regular eye and dental care, use of seat belts, and avoidance of moderate alcohol use and tobacco use.   BMI: discussed patient's BMI and encouraged positive lifestyle modifications to help get to or maintain a target BMI.  HM needs and immunizations were addressed and ordered. See below for orders. See HM and immunization section for updates. Up to date  Routine labs and screening tests ordered including cmp, cbc and lipids where appropriate.  Discussed recommendations regarding Vit D and calcium supplementation (see AVS)  Chronic disease f/u and/or acute problem visit: (deemed necessary to be done in addition to the wellness visit):  HTN: uncontrolled. See AVS. Increase to 10 amlodiopine daily and add hctz to losartan. Check renal and lytes today  HLD: recheck fasting labs. Adjust up statin if needed.   Obesity: discussed  diet changes. Continue regular exercise  Adenomatous polyps: 5year recall for repeat colonoscopy screen  osa on cpap: stabe with reported good effects  gerd is well controlled with diet.   Follow up: Return in about 6 months (around 12/17/2019) for follow up Hypertension.   Commons side effects, risks, benefits, and alternatives for medications and treatment plan prescribed today were discussed, and the patient expressed  understanding of the given instructions. Patient is instructed to call or message via MyChart if he/she has any questions or concerns regarding our treatment plan. No barriers to understanding were identified. We discussed Red Flag symptoms and signs in detail. Patient expressed understanding regarding what to do in case of urgent or emergency type symptoms.   Medication list was reconciled, printed and provided to the patient in AVS. Patient instructions and summary information was reviewed with the patient as documented in the AVS. This note was prepared with assistance of Dragon voice recognition software. Occasional wrong-word or sound-a-like substitutions may have occurred due to the inherent limitations of voice recognition software  This visit occurred during the SARS-CoV-2 public health emergency.  Safety protocols were in place, including screening questions prior to the visit, additional usage of staff PPE, and extensive cleaning of exam room while observing appropriate contact time as indicated for disinfecting solutions.   Orders Placed This Encounter  Procedures  . CBC with Differential/Platelet  . Comprehensive metabolic panel  . Lipid panel  . TSH  . HIV Antibody (routine testing w rflx)  . POCT urinalysis dipstick   No orders of the defined types were placed in this encounter.

## 2019-06-17 LAB — HIV ANTIBODY (ROUTINE TESTING W REFLEX): HIV 1&2 Ab, 4th Generation: NONREACTIVE

## 2019-07-16 ENCOUNTER — Ambulatory Visit (INDEPENDENT_AMBULATORY_CARE_PROVIDER_SITE_OTHER): Payer: BC Managed Care – PPO

## 2019-07-16 ENCOUNTER — Other Ambulatory Visit: Payer: Self-pay | Admitting: Podiatry

## 2019-07-16 ENCOUNTER — Encounter: Payer: Self-pay | Admitting: Podiatry

## 2019-07-16 ENCOUNTER — Other Ambulatory Visit: Payer: Self-pay

## 2019-07-16 ENCOUNTER — Ambulatory Visit: Payer: BC Managed Care – PPO | Admitting: Podiatry

## 2019-07-16 VITALS — BP 134/78 | HR 90 | Temp 97.7°F | Resp 16

## 2019-07-16 DIAGNOSIS — M7661 Achilles tendinitis, right leg: Secondary | ICD-10-CM

## 2019-07-16 DIAGNOSIS — M79671 Pain in right foot: Secondary | ICD-10-CM

## 2019-07-16 MED ORDER — DICLOFENAC SODIUM 75 MG PO TBEC: 75.0000 mg | DELAYED_RELEASE_TABLET | Freq: Two times a day (BID) | ORAL | 2 refills | Status: DC

## 2019-07-16 NOTE — Patient Instructions (Signed)

## 2019-07-16 NOTE — Progress Notes (Signed)
   Subjective:    Patient ID: Manuel Wagner, male    DOB: 1970-07-03, 49 y.o.   MRN: [id number]  HPI    Review of Systems  All other systems reviewed and are negative.      Objective:   Physical Exam        Assessment & Plan:

## 2019-07-16 NOTE — Progress Notes (Signed)
Subjective:   Patient ID: Manuel Wagner, male   DOB: 49 y.o.   MRN: [id number]   HPI Patient presents stating getting a lot of pain in the back of the right heel and states it is on and off and when his foot is on too much he gets soreness.  He does stretching and heat which seems to help but it worsened gradually and its been present for around 3 months.  Patient does not smoke likes to be active and is moderately obese   Review of Systems  All other systems reviewed and are negative.       Objective:  Physical Exam Vitals and nursing note reviewed.  Constitutional:      Appearance: He is well-developed.  Pulmonary:     Effort: Pulmonary effort is normal.  Musculoskeletal:        General: Normal range of motion.  Skin:    General: Skin is warm.  Neurological:     Mental Status: He is alert.     Neurovascular status intact muscle strength found to be adequate range of motion within normal limits with moderate equinus condition bilateral.  Patient has exquisite posterior Achilles tendinitis right with inflammation mostly around the lateral side with the central and medial side healthy with no indications of pathology.  Patient is found to have good digital flow and is well oriented x3     Assessment:  Acute Achilles tendinitis right lateral side with inflammation     Plan:  H&P conditions reviewed sterile prep done and I carefully injected the lateral side of the Achilles 3 mg dexamethasone Kenalog 5 mg Xylocaine and I applied air fracture walker to immobilize the Achilles tendon after first discussing prior to the injection the possibility for rupture associated with this.  Patient was willing to undergo procedure with risk and at this time he will utilize immobilization and be seen back in 3 weeks.  Also placed him on diclofenac 75 mg twice daily  X-rays indicate posterior spur formation no indication of stress fracture arthritis

## 2019-08-06 ENCOUNTER — Ambulatory Visit: Payer: BC Managed Care – PPO | Admitting: Podiatry

## 2019-08-06 ENCOUNTER — Encounter: Payer: Self-pay | Admitting: Podiatry

## 2019-08-06 ENCOUNTER — Other Ambulatory Visit: Payer: Self-pay

## 2019-08-06 VITALS — Temp 96.5°F

## 2019-08-06 DIAGNOSIS — M7661 Achilles tendinitis, right leg: Secondary | ICD-10-CM

## 2019-08-06 NOTE — Progress Notes (Signed)
Subjective:   Patient ID: Manuel Wagner, male   DOB: 49 y.o.   MRN: [id number]   HPI Patient presents stating that the right heel is improved with pain upon intense activity and states that he wants to resume exercise at this time and is still taking his anti-inflammatory   ROS      Objective:  Physical Exam  Neurovascular status intact with equinus condition noted discomfort posterior heel right lateral side improved present upon deep palpation with significant reduction of the discomfort at this time.     Assessment:  Inflammatory Achilles tendinitis improved with pain still present upon deep palpation     Plan:  H&P reviewed condition discussed options available.  We will continue physical therapy continue ice therapy heel lift was dispensed today and I discussed oral anti-inflammatories and recommended he finish 2 a day and then go to 1 a day for several weeks.  I discussed activity levels to increase boot usage as needed and patient will present as symptoms indicate

## 2019-08-31 ENCOUNTER — Encounter: Payer: Self-pay | Admitting: Family Medicine

## 2019-08-31 ENCOUNTER — Other Ambulatory Visit: Payer: Self-pay

## 2019-08-31 MED ORDER — AMLODIPINE BESYLATE 5 MG PO TABS: 10.0000 mg | ORAL_TABLET | Freq: Every day | ORAL | 3 refills | Status: DC

## 2019-08-31 MED ORDER — LOSARTAN POTASSIUM-HCTZ 100-12.5 MG PO TABS: 1.0000 | ORAL_TABLET | Freq: Every day | ORAL | 3 refills | Status: DC

## 2019-08-31 NOTE — Telephone Encounter (Signed)
Medication has been sent to the patient's pharmacy.  

## 2019-09-24 ENCOUNTER — Encounter: Payer: Self-pay | Admitting: Physician Assistant

## 2019-09-24 ENCOUNTER — Ambulatory Visit: Payer: BC Managed Care – PPO | Admitting: Physician Assistant

## 2019-09-24 ENCOUNTER — Other Ambulatory Visit: Payer: Self-pay

## 2019-09-24 DIAGNOSIS — L814 Other melanin hyperpigmentation: Secondary | ICD-10-CM

## 2019-09-24 DIAGNOSIS — L820 Inflamed seborrheic keratosis: Secondary | ICD-10-CM

## 2019-09-24 DIAGNOSIS — Z1283 Encounter for screening for malignant neoplasm of skin: Secondary | ICD-10-CM | POA: Diagnosis not present

## 2019-09-24 DIAGNOSIS — Z87898 Personal history of other specified conditions: Secondary | ICD-10-CM

## 2019-09-24 DIAGNOSIS — Z86018 Personal history of other benign neoplasm: Secondary | ICD-10-CM | POA: Diagnosis not present

## 2019-09-24 MED ORDER — TRETINOIN 0.05 % EX CREA: TOPICAL_CREAM | Freq: Every evening | CUTANEOUS | 2 refills | Status: DC

## 2019-09-24 MED ORDER — HYDROQUINONE 4 % EX CREA: TOPICAL_CREAM | Freq: Two times a day (BID) | CUTANEOUS | 2 refills | Status: DC

## 2019-09-24 NOTE — Progress Notes (Signed)
   Follow up Visit  Subjective  Manuel Wagner is a 49 y.o. male who presents for the following: Annual Exam (h/o atypical moles). He feels like he may need a stronger medication for the lentigo on his face.   Objective  Well appearing patient in no apparent distress; mood and affect are within normal limits.  A full examination was performed including scalp, head, eyes, ears, nose, lips, neck, chest, axillae, abdomen, back, buttocks, bilateral upper extremities, bilateral lower extremities, hands, feet, fingers, toes, fingernails, and toenails. All findings within normal limits unless otherwise noted below. No suspicious moles noted on back.   Objective  Nose: Scattered tan macules.   Objective  Mid Back: Atypical nevi x 2. 1 mild and 1 moderate. [id number]  Objective  Left Buccal Cheek , Left Malar Cheek, Left Temple (2), Left Thigh - Posterior, Left Upper Arm - Posterior, Right Temporal Scalp: Erythematous stuck-on, waxy papule or plaque.   Assessment & Plan  Lentigines Nose  tretinoin (RETIN-A) 0.05 % cream - Nose  hydroquinone 4 % cream - Nose  History of atypical nevus Mid Back  Inflamed seborrheic keratosis (7) Left Upper Arm - Posterior; Left Thigh - Posterior; Left Temple (2); Left Malar Cheek; Left Buccal Cheek ; Right Temporal Scalp  Destruction of lesion - Left Buccal Cheek , Left Malar Cheek, Left Temple, Left Thigh - Posterior, Left Upper Arm - Posterior, Right Temporal Scalp Complexity: simple   Destruction method: cryotherapy   Informed consent: discussed and consent obtained   Timeout:  patient name, date of birth, surgical site, and procedure verified Lesion destroyed using liquid nitrogen: Yes   Outcome: patient tolerated procedure well with no complications    Skin exam for malignant neoplasm Right Breast  Total body skin exam

## 2019-12-17 ENCOUNTER — Other Ambulatory Visit: Payer: Self-pay

## 2019-12-17 ENCOUNTER — Encounter: Payer: Self-pay | Admitting: Family Medicine

## 2019-12-17 ENCOUNTER — Ambulatory Visit: Payer: BC Managed Care – PPO | Admitting: Family Medicine

## 2019-12-17 VITALS — BP 130/80 | HR 75 | Temp 97.8°F | Resp 18 | Ht 71.0 in | Wt 261.0 lb

## 2019-12-17 DIAGNOSIS — Z23 Encounter for immunization: Secondary | ICD-10-CM

## 2019-12-17 DIAGNOSIS — M7731 Calcaneal spur, right foot: Secondary | ICD-10-CM | POA: Diagnosis not present

## 2019-12-17 DIAGNOSIS — E669 Obesity, unspecified: Secondary | ICD-10-CM | POA: Diagnosis not present

## 2019-12-17 DIAGNOSIS — Z9989 Dependence on other enabling machines and devices: Secondary | ICD-10-CM

## 2019-12-17 DIAGNOSIS — I10 Essential (primary) hypertension: Secondary | ICD-10-CM

## 2019-12-17 DIAGNOSIS — G4733 Obstructive sleep apnea (adult) (pediatric): Secondary | ICD-10-CM

## 2019-12-17 MED ORDER — AMLODIPINE BESYLATE 10 MG PO TABS: 10.0000 mg | ORAL_TABLET | Freq: Every day | ORAL | 3 refills | Status: DC

## 2019-12-17 NOTE — Patient Instructions (Addendum)
Please return in 6 months12 months for your annual complete physical; please come fasting.  Please check your blood pressures again the months before your next appointment. I've ordered the larger 10mg  amlodipine tab for you so you will only take one tab daily. Continue your losartan-hct as is.  Today you were given your flu vaccination.   If you have any questions or concerns, please don't hesitate to send me a message via MyChart or call the office at [phone]. Thank you for visiting with [place] today! It's our pleasure caring for you.

## 2019-12-17 NOTE — Progress Notes (Signed)
Subjective  CC:  Chief Complaint  Patient presents with  . Hypertension    Tolerating medication well.     HPI: Manuel Wagner is a 49 y.o. male who presents to the office today to address the problems listed above in the chief complaint.  Hypertension f/u: Control is Much improved.  At last visit we increased amlodipine to 10 mg daily and added HCTZ.  He has tolerated these changes well.  Home readings from the months of March and April daily show control with blood pressures averaging 120 over 70s to 130/80.  Pt reports he is doing well. taking medications as instructed, no medication side effects noted, no TIAs, no chest pain on exertion, no dyspnea on exertion, no swelling of ankles. He denies adverse effects from his BP medications. Compliance with medication is good.   Obesity: Continues to work on weight loss but is struggling.  Exercise was limited due to a bout of Achilles tendinitis and heel spurs.  He continues to struggle with this.  He is seeing podiatry negative reviewed these notes.  He is trying to avoid surgery.  He was walking for exercise but this exacerbates his pain.  Diet is improved, eating vegetarian about twice weekly.  His weight is down almost 5 pounds in the last 6 months.  Continues on CPAP for sleep apnea and feels sleep is restorative.  No chest pain, orthopnea, dyspnea on exertion or daytime sleepiness  Health maintenance: Flu shot today  Assessment  1. Essential hypertension   2. OSA on CPAP   3. Obesity (BMI 30-39.9)   4. Heel spur, right   5. Need for influenza vaccination      Plan    Hypertension f/u: BP control is well controlled.  Continue current medications.  Refills given.  Sleep apnea f/u: Stable clinically.  Continue CPAP  Obesity: Educated on different recent get aerobic activity.  Continue low-fat diet.  Heel spur and Achilles tendinitis: Continue conservative management.  Counseling done.  Recommend giving her another 4 months or  so to see if he can get healed.  Otherwise any as needed.  Flu vaccine updated today. Education regarding management of these chronic disease states was given. Management strategies discussed on successive visits include dietary and exercise recommendations, goals of achieving and maintaining IBW, and lifestyle modifications aiming for adequate sleep and minimizing stressors.   Follow up: 6 months for complete physical  Orders Placed This Encounter  Procedures  . Flu Vaccine QUAD 6+ mos PF IM (Fluarix Quad PF)   Meds ordered this encounter  Medications  . amLODipine (NORVASC) 10 MG tablet    Sig: Take 1 tablet (10 mg total) by mouth daily.    Dispense:  90 tablet    Refill:  3    Replacing the 5mg  tabs. thanks      BP Readings from Last 3 Encounters:  12/17/19 130/80  07/16/19 134/78  06/16/19 (!) 154/88   Wt Readings from Last 3 Encounters:  12/17/19 261 lb (118.4 kg)  06/16/19 265 lb 12.8 oz (120.6 kg)  03/19/19 256 lb (116.1 kg)    Lab Results  Component Value Date   CHOL 145 06/16/2019   Lab Results  Component Value Date   HDL 32.90 (L) 06/16/2019   Lab Results  Component Value Date   LDLCALC 89 06/16/2019   Lab Results  Component Value Date   TRIG 116.0 06/16/2019   Lab Results  Component Value Date   CHOLHDL 4 06/16/2019  No results found for: LDLDIRECT Lab Results  Component Value Date   CREATININE 0.74 06/16/2019   BUN 11 06/16/2019   NA 138 06/16/2019   K 4.6 06/16/2019   CL 104 06/16/2019   CO2 24 06/16/2019    The 10-year ASCVD risk score Mikey Bussing DC Jr., et al., 2013) is: 3.3%   Values used to calculate the score:     Age: 60 years     Sex: Male     Is Non-Hispanic African American: No     Diabetic: No     Tobacco smoker: No     Systolic Blood Pressure: 696 mmHg     Is BP treated: Yes     HDL Cholesterol: 32.9 mg/dL     Total Cholesterol: 145 mg/dL  I reviewed the patients updated PMH, FH, and SocHx.    Patient Active Problem List    Diagnosis Date Noted  . Family history of colon cancer 12/16/2018    Priority: High  . Adenomatous polyp 12/16/2018    Priority: High  . Mixed hyperlipidemia 12/16/2018    Priority: High  . Essential hypertension 12/16/2018    Priority: High  . OSA on CPAP 12/16/2018    Priority: High  . Obesity (BMI 30-39.9) 12/16/2018    Priority: High  . GERD (gastroesophageal reflux disease) 12/16/2018    Priority: Medium  . ED (erectile dysfunction) 12/16/2018    Priority: Low    Allergies: Lisinopril  Social History: Patient  reports that he has never smoked. He has never used smokeless tobacco. He reports current alcohol use. He reports that he does not use drugs.  Current Meds  Medication Sig  . amLODipine (NORVASC) 10 MG tablet Take 1 tablet (10 mg total) by mouth daily.  . hydroquinone 4 % cream Apply topically 2 (two) times daily.  Marland Kitchen losartan-hydrochlorothiazide (HYZAAR) 100-12.5 MG tablet Take 1 tablet by mouth daily.  . pravastatin (PRAVACHOL) 10 MG tablet Take 1 tablet (10 mg total) by mouth daily.  . sildenafil (VIAGRA) 50 MG tablet Take 1 tablet (50 mg total) by mouth daily as needed for erectile dysfunction.  Marland Kitchen tretinoin (RETIN-A) 0.05 % cream Apply topically at bedtime.  . [DISCONTINUED] amLODipine (NORVASC) 5 MG tablet Take 2 tablets (10 mg total) by mouth daily.    Review of Systems: Cardiovascular: negative for chest pain, palpitations, leg swelling, orthopnea Respiratory: negative for SOB, wheezing or persistent cough Gastrointestinal: negative for abdominal pain Genitourinary: negative for dysuria or gross hematuria  Objective  Vitals: BP 130/80   Pulse 75   Temp 97.8 F (36.6 C) (Temporal)   Resp 18   Ht 5\' 11"  (1.803 m)   Wt 261 lb (118.4 kg)   SpO2 98%   BMI 36.40 kg/m  General: no acute distress  Psych:  Alert and oriented, normal mood and affect HEENT:  Normocephalic, atraumatic, supple neck  Cardiovascular:  RRR without murmur. no  edema Respiratory:  Good breath sounds bilaterally, CTAB with normal respiratory effort Skin:  Warm, no rashes Neurologic:   Mental status is normal  Commons side effects, risks, benefits, and alternatives for medications and treatment plan prescribed today were discussed, and the patient expressed understanding of the given instructions. Patient is instructed to call or message via MyChart if he/she has any questions or concerns regarding our treatment plan. No barriers to understanding were identified. We discussed Red Flag symptoms and signs in detail. Patient expressed understanding regarding what to do in case of urgent or emergency type symptoms.  Medication list was reconciled, printed and provided to the patient in AVS. Patient instructions and summary information was reviewed with the patient as documented in the AVS. This note was prepared with assistance of Dragon voice recognition software. Occasional wrong-word or sound-a-like substitutions may have occurred due to the inherent limitations of voice recognition software  This visit occurred during the SARS-CoV-2 public health emergency.  Safety protocols were in place, including screening questions prior to the visit, additional usage of staff PPE, and extensive cleaning of exam room while observing appropriate contact time as indicated for disinfecting solutions.

## 2020-01-22 ENCOUNTER — Other Ambulatory Visit: Payer: BC Managed Care – PPO

## 2020-01-22 DIAGNOSIS — Z20822 Contact with and (suspected) exposure to covid-19: Secondary | ICD-10-CM | POA: Diagnosis not present

## 2020-01-24 LAB — NOVEL CORONAVIRUS, NAA: SARS-CoV-2, NAA: NOT DETECTED

## 2020-01-24 LAB — SARS-COV-2, NAA 2 DAY TAT

## 2020-01-25 ENCOUNTER — Telehealth: Payer: Self-pay

## 2020-01-25 NOTE — Telephone Encounter (Signed)
Called and informed patient that test for Covid 19 was NEGATIVE. Discussed signs and symptoms of Covid 19 : fever, chills, respiratory symptoms, cough, ENT symptoms, sore throat, SOB, muscle pain, diarrhea, headache, loss of taste/smell, close exposure to COVID-19 patient. Pt instructed to call PCP if they develop the above signs and sx. Pt also instructed to call 911 if having respiratory issues/distress. Discussed MyChart enrollment. Pt verbalized understanding.  

## 2020-02-10 ENCOUNTER — Emergency Department (HOSPITAL_COMMUNITY)
Admission: EM | Admit: 2020-02-10 | Discharge: 2020-02-10 | Disposition: A | Discharge status: Home / Self Care | Payer: BC Managed Care – PPO | Attending: Emergency Medicine | Admitting: Emergency Medicine

## 2020-02-10 ENCOUNTER — Emergency Department (HOSPITAL_COMMUNITY): Payer: BC Managed Care – PPO

## 2020-02-10 ENCOUNTER — Encounter (HOSPITAL_COMMUNITY): Payer: Self-pay

## 2020-02-10 ENCOUNTER — Other Ambulatory Visit: Payer: Self-pay

## 2020-02-10 DIAGNOSIS — G4733 Obstructive sleep apnea (adult) (pediatric): Secondary | ICD-10-CM | POA: Diagnosis not present

## 2020-02-10 DIAGNOSIS — R0789 Other chest pain: Secondary | ICD-10-CM | POA: Insufficient documentation

## 2020-02-10 DIAGNOSIS — R1013 Epigastric pain: Secondary | ICD-10-CM | POA: Insufficient documentation

## 2020-02-10 DIAGNOSIS — Z79899 Other long term (current) drug therapy: Secondary | ICD-10-CM | POA: Insufficient documentation

## 2020-02-10 DIAGNOSIS — I10 Essential (primary) hypertension: Secondary | ICD-10-CM | POA: Insufficient documentation

## 2020-02-10 DIAGNOSIS — R079 Chest pain, unspecified: Secondary | ICD-10-CM | POA: Diagnosis not present

## 2020-02-10 LAB — CBC
HCT: 47.3 % (ref 39.0–52.0)
Hemoglobin: 16.5 g/dL (ref 13.0–17.0)
MCH: 28.4 pg (ref 26.0–34.0)
MCHC: 34.9 g/dL (ref 30.0–36.0)
MCV: 81.6 fL (ref 80.0–100.0)
Platelets: 319 10*3/uL (ref 150–400)
RBC: 5.8 MIL/uL (ref 4.22–5.81)
RDW: 14 % (ref 11.5–15.5)
WBC: 11.3 10*3/uL — ABNORMAL HIGH (ref 4.0–10.5)
nRBC: 0 % (ref 0.0–0.2)

## 2020-02-10 LAB — BASIC METABOLIC PANEL
Anion gap: 14 (ref 5–15)
BUN: 12 mg/dL (ref 6–20)
CO2: 22 mmol/L (ref 22–32)
Calcium: 9.3 mg/dL (ref 8.9–10.3)
Chloride: 103 mmol/L (ref 98–111)
Creatinine, Ser: 0.87 mg/dL (ref 0.61–1.24)
GFR, Estimated: >60 mL/min (ref >60)
Glucose, Bld: 124 mg/dL — ABNORMAL HIGH (ref 70–99)
Potassium: 3.5 mmol/L (ref 3.5–5.1)
Sodium: 139 mmol/L (ref 135–145)

## 2020-02-10 LAB — TROPONIN I (HIGH SENSITIVITY)
Troponin I (High Sensitivity): 3 ng/L (ref <18)
Troponin I (High Sensitivity): 4 ng/L (ref <18)

## 2020-02-10 MED ORDER — ESOMEPRAZOLE MAGNESIUM 20 MG PO CPDR: 20.0000 mg | DELAYED_RELEASE_CAPSULE | Freq: Every day | ORAL | 0 refills | Status: DC

## 2020-02-10 NOTE — ED Provider Notes (Signed)
[facility] DEPART[facility] Provider Note   CSN: [id number] Arrival date & time: 02/10/20  1646     History Chief Complaint  Patient presents with  . Chest Pain    Manuel Wagner is a 49 y.o. male hx of hypertension, reflux here presenting with chest pain.  Patient did eat something spicy yesterday.  Patient states that afterwards he had some epigastric pain.  It was a burning sensation.  It did radiate to the left jaw area.  He states that he took some Tums and felt better.  Today had an episode of chest tightness as well.  He states that he has no personal history of CAD.  However his father has MI age 44s.   The history is provided by the patient.       Past Medical History:  Diagnosis Date  . Atypical nevus 2009   Back-Moderate (Exc) Derm in [place]  . Atypical nevus 2019   Back Midline-Mild ([facility]  . Colon polyp 12/16/2018   Colonoscopy 2013  . Essential hypertension 12/16/2018  . Family history of colon cancer 12/16/2018  . GERD (gastroesophageal reflux disease) 12/16/2018  . Mixed hyperlipidemia 12/16/2018  . OSA on CPAP 12/16/2018   Since 2012    Patient Active Problem List   Diagnosis Date Noted  . Family history of colon cancer 12/16/2018  . Adenomatous polyp 12/16/2018  . Mixed hyperlipidemia 12/16/2018  . Essential hypertension 12/16/2018  . ED (erectile dysfunction) 12/16/2018  . GERD (gastroesophageal reflux disease) 12/16/2018  . OSA on CPAP 12/16/2018  . Obesity (BMI 30-39.9) 12/16/2018    No past surgical history on file.     Family History  Problem Relation Age of Onset  . Hyperlipidemia Mother   . Arthritis Father   . Colon cancer Father   . Heart disease Father   . Hearing loss Father   . Hypertension Father   . Alcohol abuse Brother   . Hyperlipidemia Brother   . Hypertension Brother   . Alcohol abuse Maternal Grandfather   . Alcohol abuse Paternal Grandfather   . Esophageal cancer Neg Hx   . Rectal cancer Neg Hx     . Stomach cancer Neg Hx     Social History   Tobacco Use  . Smoking status: Never Smoker  . Smokeless tobacco: Never Used  Vaping Use  . Vaping Use: Never used  Substance Use Topics  . Alcohol use: Yes    Comment: ocasionally  . Drug use: Never    Home Medications Prior to Admission medications   Medication Sig Start Date End Date Taking? Authorizing Provider  amLODipine (NORVASC) 10 MG tablet Take 1 tablet (10 mg total) by mouth daily. 12/17/19  Yes [name], MD  hydroquinone 4 % cream Apply topically 2 (two) times daily. 09/24/19  Yes Clark-Bruning, Anderson Malta, PA-C  losartan-hydrochlorothiazide (HYZAAR) 100-12.5 MG tablet Take 1 tablet by mouth daily. 08/31/19  Yes [name], MD  pravastatin (PRAVACHOL) 10 MG tablet Take 1 tablet (10 mg total) by mouth daily. 05/25/19  Yes [name], MD  sildenafil (VIAGRA) 50 MG tablet Take 1 tablet (50 mg total) by mouth daily as needed for erectile dysfunction. 05/25/19  Yes [name], MD  tretinoin (RETIN-A) 0.05 % cream Apply topically at bedtime. 09/24/19 09/23/20 Yes Clark-Bruning, Anderson Malta, PA-C  esomeprazole (NEXIUM) 20 MG capsule Take 1 capsule (20 mg total) by mouth daily. 02/10/20   Drenda Freeze, MD    Allergies  Lisinopril  Review of Systems   Review of Systems  Cardiovascular: Positive for chest pain.  All other systems reviewed and are negative.   Physical Exam Updated Vital Signs BP 133/88   Pulse 89   Temp 98.6 F (37 C) (Oral)   Resp 12   Ht 5\' 11"  (1.803 m)   Wt 117 kg   SpO2 96%   BMI 35.98 kg/m   Physical Exam Vitals and nursing note reviewed.  Constitutional:      Appearance: He is well-developed.  HENT:     Head: Normocephalic.  Eyes:     Extraocular Movements: Extraocular movements intact.     Pupils: Pupils are equal, round, and reactive to light.  Cardiovascular:     Rate and Rhythm: Normal rate and regular rhythm.     Heart sounds: Normal heart sounds.  Pulmonary:      Effort: Pulmonary effort is normal.     Breath sounds: Normal breath sounds.  Abdominal:     General: Bowel sounds are normal.     Palpations: Abdomen is soft.  Musculoskeletal:        General: Normal range of motion.     Cervical back: Normal range of motion and neck supple.  Skin:    General: Skin is warm.     Capillary Refill: Capillary refill takes less than 2 seconds.  Neurological:     General: No focal deficit present.     Mental Status: He is alert and oriented to person, place, and time.  Psychiatric:        Mood and Affect: Mood normal.        Behavior: Behavior normal.     ED Results / Procedures / Treatments   Labs (all labs ordered are listed, but only abnormal results are displayed) Labs Reviewed  BASIC METABOLIC PANEL - Abnormal; Notable for the following components:      Result Value   Glucose, Bld 124 (*)    All other components within normal limits  CBC - Abnormal; Notable for the following components:   WBC 11.3 (*)    All other components within normal limits  TROPONIN I (HIGH SENSITIVITY)  TROPONIN I (HIGH SENSITIVITY)    EKG EKG Interpretation  Date/Time:  Wednesday February 10 2020 17:15:12 EDT Ventricular Rate:  96 PR Interval:  186 QRS Duration: 102 QT Interval:  354 QTC Calculation: 447 R Axis:   -22 Text Interpretation: Normal sinus rhythm Low voltage QRS Borderline ECG No previous ECGs available Confirmed by Wandra Arthurs [phone]) on 02/10/2020 8:45:47 PM   Radiology DG Chest 2 View  Result Date: 02/10/2020 CLINICAL DATA:  Pain EXAM: CHEST - 2 VIEW COMPARISON:  None. FINDINGS: The heart size is unremarkable. There is no pneumothorax or large pleural effusion. No focal infiltrate. There is a tiny nodular density at the left costophrenic angle not well visualized on the lateral view. IMPRESSION: 1. No active cardiopulmonary disease. 2. Tiny nodular density at the left costophrenic angle, not well visualized on the lateral view. A follow-up  two-view chest x-ray is recommended in 4-6 weeks to confirm resolution of this finding. Electronically Signed   By: Constance Holster M.D.   On: 02/10/2020 18:12    Procedures Procedures (including critical care time)  Medications Ordered in ED Medications - No data to display  ED Course  I have reviewed the triage vital signs and the nursing notes.  Pertinent labs & imaging results that were available during my care of the patient  were reviewed by me and considered in my medical decision making (see chart for details).    MDM Rules/Calculators/A&P                         Manuel Wagner is a 49 y.o. male here presenting with chest pain.  Patient ate some spicy food and had some chest pain.  Likely reflux. However patient does have history of hypertension and family history of CAD.  Low suspicion for ACS.  Plan to get CBC, BMP, troponin x2.  10:27 PM Troponin negative x2.  Pain-free now.  His blood pressure is now normal with no intervention.  We will have him follow-up with GI and start Nexium daily.  If he has persistent pain he will likely need a stress test.    Final Clinical Impression(s) / ED Diagnoses Final diagnoses:  Other chest pain    Rx / DC Orders ED Discharge Orders         Ordered    esomeprazole (NEXIUM) 20 MG capsule  Daily        02/10/20 2224           Drenda Freeze, MD 02/10/20 2228

## 2020-02-10 NOTE — ED Triage Notes (Signed)
Pt reports intermittent upper chest pain starting last night, symptoms passed away then later today the discomfort came back associated with Left jaw area.

## 2020-02-10 NOTE — Discharge Instructions (Addendum)
You likely have reflux.  Avoid eating spicy food.  You can take Nexium daily.  See GI for follow-up.  Return to ER if you have worse chest pain, trouble breathing

## 2020-02-24 NOTE — Progress Notes (Signed)
02/24/2020 Manuel Wagner [id number] 08-Nov-1970   Chief Complaint: GERD  History of Present Illness: Manuel Wagner is a 49 year old male with a past medical history of hypertension, hyperlipidemia, sleep apnea on cpap, GERD and colon polyps.  He presents to our office for further evaluation regarding GERD symptoms.  He developed a burning type pain to his upper chest which was different from his typical heartburn on 02/09/2020.  His chest burning radiated up into his jaw.  No associated palpitations, dizziness or shortness of breath.  No specific food triggers.  He went to bed and the next morning he awakened and he felt fairly well then later in the afternoon his upper chest burning discomfort recurred and he was concerned he might be having heart issues.  He presented to [facility] ED on 02/10/2020 and a 12 lead  EKG showed NSR without acute ischemic changes. Troponin negative x 2. A chest xray showed a tiny nodular density at the left costophrenic angle otherwise was unremarkable. He was prescribed Esomeprazole 20mg  daily and he was advised to follow-up in our office for further GI evaluation.  Currently, he reports feeling well.  He denies having any further atypical chest pain or jaw pain.  He is able to climb 2 flights of stairs without having any chest pain/burning discomfort.  He reports having heartburn primarily described as acid in his throat a few days weekly for the past few months.  He reported having mild dysphagia symptoms for 2 days after his EGD visit.  He felt as if it was harder to swallow food but this symptom abated by the third day.  He took the Esomeprazole 20 mg as prescribed by the ED physician for less than 1 week then stopped it as he felt he did not need it anymore.  In the past, he took Tums for heartburn with relief.  Stress level has been elevated for the past 6 to 7 months as his kitchen is under ongoing renovation.  He underwent a cardiac stress test 10 years ago after he  had bronchitis and a rapid heart rate which was reported was normal.  His father had a MI in his early 31s and he was diagnosed with colon cancer in his 53s.  He underwent a colonoscopy by Dr. Tarri Glenn 03/19/2019 which identified diverticulosis to the sigmoid, descending and ascending colon, a 37mm tubular adenomatous polyp to the transverse colon was removed and erythematous mucosa in the sigmoid, descending and transverse colon was noted and biopsies showed mildly active colitis without evidence of dysplasia.  Rectal biopsies showed normal colonic mucosa.  He was advised to follow-up in our office if he developed any diarrhea or abdominal pain and to repeat a colonoscopy in 5 years.   Colonoscopy 03/19/2019 by Dr. Tarri Glenn:  - Diverticulosis in the sigmoid colon, in the descending colon and in the ascending colon. - One 1 mm polyp in the proximal transverse colon, removed with a cold biopsy forceps. Resected and retrieved. - Erythematous mucosa in the sigmoid colon, descending colon, and transverse colon. Biopsied. - Non-bleeding internal hemorrhoids. Biopsies: 1. Surgical [P], colon, transverse, polyp - TUBULAR ADENOMA. - NO HIGH GRADE DYSPLASIA OR MALIGNANCY. 2. Surgical [P], colon, descending and transverse - CHRONIC MILDLY ACTIVE COLITIS, SEE COMMENT. - HSVI, HSVII, AND CMV ARE NEGATIVE. - NO DYSPLASIA OR MALIGNANCY. 3. Surgical [P], colon, rectum - BENIGN COLONIC MUCOSA. - NO ACTIVE INFLAMMATION OR EVIDENCE OF MICROSCOPIC COLITIS. - NO DYSPLASIA OR MALIGNANCY.  CBC Latest Ref  Rng & Units 02/10/2020 06/16/2019  WBC 4.0 - 10.5 K/uL 11.3(H) 9.1  Hemoglobin 13.0 - 17.0 g/dL 16.5 16.9  Hematocrit 39 - 52 % 47.3 48.1  Platelets 150 - 400 K/uL 319 287.0   CMP Latest Ref Rng & Units 02/10/2020 06/16/2019  Glucose 70 - 99 mg/dL 124(H) 107(H)  BUN 6 - 20 mg/dL 12 11  Creatinine 0.61 - 1.24 mg/dL 0.87 0.74  Sodium 135 - 145 mmol/L 139 138  Potassium 3.5 - 5.1 mmol/L 3.5 4.6  Chloride 98 - 111  mmol/L 103 104  CO2 22 - 32 mmol/L 22 24  Calcium 8.9 - 10.3 mg/dL 9.3 9.7  Total Protein 6.0 - 8.3 g/dL - 7.4  Total Bilirubin 0.2 - 1.2 mg/dL - 1.0  Alkaline Phos 39 - 117 U/L - 64  AST 0 - 37 U/L - 18  ALT 0 - 53 U/L - 21  TSH 1.95 on 06/16/2019   Current Medications, Allergies, Past Medical History, Past Surgical History, Family History and Social History were reviewed in [employer] record.   Review of Systems:   Constitutional: Negative for fever, sweats, chills or weight loss.  Respiratory: Negative for shortness of breath.   Cardiovascular: Negative for chest pain, palpitations and leg swelling.  Gastrointestinal: See HPI.  Musculoskeletal: Negative for back pain or muscle aches.  Neurological: Negative for dizziness, headaches or paresthesias.    Physical Exam: BP 124/70    Pulse 76    Ht 5\' 11"  (1.803 m)    Wt 260 lb (117.9 kg)    BMI 36.26 kg/m   General: Well developed 49 year old male in no acute distress. Head: Normocephalic and atraumatic. Eyes: No scleral icterus. Conjunctiva pink . Ears: Normal auditory acuity. Mouth: Dentition intact. No ulcers or lesions.  Lungs: Clear throughout to auscultation. Heart: Regular rate and rhythm, no murmur. Abdomen: Soft, nontender and nondistended. No masses or hepatomegaly. Normal bowel sounds x 4 quadrants.  Rectal: Deferred.  Musculoskeletal: Symmetrical with no gross deformities. Extremities: No edema. Neurological: Alert oriented x 4. No focal deficits.  Psychological: Alert and cooperative. Normal mood and affect  Assessment and Recommendations: 44. 49 year old male with GERD. Dysphagia for a few days resolved.  -Restart Esomeprazole 20mg  QD if heartburn recurs  -GERD handout -Eventual EGD after cardiac evaluation completed  2. Atypical chest pain radiated up into jaw. Risk factors for CAD indlude hypercholesterolemia, overweight and + family history of CAD. -Cardiology referral/stress test.     3.  History of a small tubular adenomatous polyp.  Family history of colon cancer in first-degree relative (father). -Next colonoscopy due 08/2023  4.  Anxiety/increased stress level -Follow up with PCP

## 2020-02-25 ENCOUNTER — Encounter: Payer: Self-pay | Admitting: Family Medicine

## 2020-02-25 ENCOUNTER — Ambulatory Visit: Payer: BC Managed Care – PPO | Admitting: Nurse Practitioner

## 2020-02-25 ENCOUNTER — Encounter: Payer: Self-pay | Admitting: Nurse Practitioner

## 2020-02-25 VITALS — BP 124/70 | HR 76 | Ht 71.0 in | Wt 260.0 lb

## 2020-02-25 DIAGNOSIS — R0789 Other chest pain: Secondary | ICD-10-CM

## 2020-02-25 DIAGNOSIS — K219 Gastro-esophageal reflux disease without esophagitis: Secondary | ICD-10-CM | POA: Diagnosis not present

## 2020-02-25 NOTE — Patient Instructions (Signed)
[facility] will contact you with an appointment date and time. If you do need hear from there office, please contact them at [phone].  Start esomeprazole 20 mg daily if heartburn occurs.  You have been given a GERD handout.

## 2020-02-25 NOTE — Progress Notes (Signed)
Reviewed and agree with management plans. ? ?Manuel Fulmore L. Moraima Burd, MD, MPH  ?

## 2020-02-29 ENCOUNTER — Encounter: Payer: Self-pay | Admitting: Family Medicine

## 2020-03-26 NOTE — Progress Notes (Signed)
Cardiology Office Note:    Date:  03/28/2020   ID:  Manuel Wagner, DOB [birth year], MRN [id number]  PCP:  [name], MD  [facility] Cardiologist:  Freada Bergeron, MD  [facility] Electrophysiologist:  None   Referring MD: Carl Best *    History of Present Illness:    Manuel Wagner is a 49 y.o. male with a hx of HTN, HLD, OSA on CPAP, and GERD who was referred by Carl Best for further evaluation of chest pain.  Patient was in the ED on 02/10/20 with episode of chest pain radiating to the jaw. Trop negative x2. ECG with NSR with low voltage in the anterior leads. Pain improved with treatment with antiacids. Discharged home with plans to follow-up with GI and Cardiology.  Today, patient states that back in late October he was in bed at night and had some chest discomfort. He sat up and moved around a bit. Took tums with improvement, however, symptoms recurred the next day. Pain was described as a discomfort/acid like feeling in the upper portion of the chest. Not worsened with exertion. No episodes since that time. 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 No orthopnea, PND, shortness of breath, palpitations, lightheadedness, syncope. Has been compliant with home CPAP.  Walks for exercise. Has achilles tendonitis which limits his mobility.   No smoking, no alcohol use, tries to avoid spicy food as triggers his reflux  Family history: Father with ACB at age 51.   Past Medical History:  Diagnosis Date  . Atypical nevus 2009   Back-Moderate (Exc) Derm in [place]  . Atypical nevus 2019   Back Midline-Mild ([facility]c) [facility]  . Colon polyp 12/16/2018   Colonoscopy 2013  . Essential hypertension 12/16/2018  . Family history of colon cancer 12/16/2018  . GERD (gastroesophageal reflux disease) 12/16/2018  . Mixed hyperlipidemia 12/16/2018  . OSA on CPAP 12/16/2018   Since 2012    No past surgical history on file.  Current  Medications: Current Meds  Medication Sig  . amLODipine (NORVASC) 10 MG tablet Take 1 tablet (10 mg total) by mouth daily.  Marland Kitchen esomeprazole (NEXIUM) 20 MG capsule Take 20 mg by mouth as needed.  . hydroquinone 4 % cream Apply topically 2 (two) times daily.  Marland Kitchen losartan-hydrochlorothiazide (HYZAAR) 100-12.5 MG tablet Take 1 tablet by mouth daily.  . pravastatin (PRAVACHOL) 10 MG tablet Take 1 tablet (10 mg total) by mouth daily.  . sildenafil (VIAGRA) 50 MG tablet Take 1 tablet (50 mg total) by mouth daily as needed for erectile dysfunction.  Marland Kitchen tretinoin (RETIN-A) 0.05 % cream Apply topically at bedtime.  . [DISCONTINUED] esomeprazole (NEXIUM) 20 MG capsule Take 1 capsule (20 mg total) by mouth daily. (Patient taking differently: Take 20 mg by mouth as needed.)     Allergies:   Lisinopril   Social History   Socioeconomic History  . Marital status: Married    Spouse name: Not on file  . Number of children: 0  . Years of education: Not on file  . Highest education level: Not on file  Occupational History  . 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 Occupation: former Water quality scientist for [employer]  Tobacco Use  . Smoking status: Never Smoker  . Smokeless tobacco: Never Used  Vaping Use  . Vaping Use: Never used  Substance and Sexual Activity  . Alcohol use: Yes    Comment: ocasionally  . Drug  use: Never  . Sexual activity: Yes  Other Topics Concern  . Not on file  Social History Narrative   Relocated from [place] area 2019; originally from [place], [place]   Social Determinants of Health   Financial Resource Strain: Not on [employer] Insecurity: Not on file  Transportation Needs: Not on file  Physical Activity: Not on file  Stress: Not on file  Social Connections: Not on file     Family History: The patient's family history includes Alcohol abuse in his brother, maternal grandfather, and paternal grandfather; Arthritis in his father; Colon cancer in his father;  Hearing loss in his father; Heart disease in his father; Hyperlipidemia in his brother and mother; Hypertension in his brother and father. There is no history of Esophageal cancer, Rectal cancer, or Stomach cancer.  ROS:   Please see the history of present illness.    Review of Systems  Constitutional: Negative for chills and fever.  HENT: Negative for congestion.   Eyes: Negative for blurred vision.  Respiratory: Negative for shortness of breath.   Cardiovascular: Positive for chest pain. Negative for palpitations, orthopnea, claudication, leg swelling and PND.  Gastrointestinal: Positive for heartburn. Negative for nausea and vomiting.  Genitourinary: Negative for dysuria.  Musculoskeletal: Positive for joint pain.  Neurological: Negative for dizziness and loss of consciousness.  Endo/Heme/Allergies: Negative for polydipsia.  Psychiatric/Behavioral: Negative for substance abuse.    EKGs/Labs/Other Studies Reviewed:    The following studies were reviewed today: No prior cardiac studies in our system  EKG:  EKG 02-12-2020. NSR with low voltage in the precordial leads. No ischemia or block.  Recent Labs: 06/16/2019: ALT 21; TSH 1.95 02-12-2020: BUN 12; Creatinine, Ser 0.87; Hemoglobin 16.5; Platelets 319; Potassium 3.5; Sodium 139  Recent Lipid Panel    Component Value Date/Time   CHOL 145 06/16/2019 0917   TRIG 116.0 06/16/2019 0917   HDL 32.90 (L) 06/16/2019 0917   CHOLHDL 4 06/16/2019 0917   VLDL 23.2 06/16/2019 0917   LDLCALC 89 06/16/2019 0917     Risk Assessment/Calculations:       Physical Exam:    VS:  BP 130/76   Pulse 85   Ht _0  (1.803 m)   Wt 262 lb (118.8 kg)   SpO2 98%   BMI 36.54 kg/m     Wt Readings from Last 3 Encounters:  03/28/20 262 lb (118.8 kg)  02/25/20 260 lb (117.9 kg)  12-Feb-2020 258 lb (117 kg)     GEN:  Well nourished, well developed in no acute distress HEENT: Normal NECK: No JVD; No carotid bruits CARDIAC: RRR, no murmurs, rubs,  gallops RESPIRATORY:  Clear to auscultation without rales, wheezing or rhonchi  ABDOMEN: Soft, non-tender, non-distended MUSCULOSKELETAL:  No edema; No deformity  SKIN: Warm and dry NEUROLOGIC:  Alert and oriented x 3 PSYCHIATRIC:  Normal affect   ASSESSMENT:    1. Chest pain, unspecified type   2. Essential hypertension   3. Pure hypercholesterolemia   4. Obstructive sleep apnea    PLAN:    In order of problems listed above:  #Chest Pain: Patient with episode of upper chest discomfort on 02/12/2020 for which he went to the ED for evaluation. Described the pain as this gnawing like sensation in the upper chest that initially improved with tums but recurred the next day. Symptoms were not worsened with activity. No associated SOB, diaphoresis, lightheadedness or dizziness. ECG in ER with NSR without ischemic changes. Trops negative. Pain resolved on it's own  and has not recurred. Patient is concerned, however, as his father required ACB in his early-50s. Also with known risk factors of CVD including HTN, HLD, and obesity. -Check coronary CTA to assess for CAD   #HTN: Well controlled at home. Managed by PCP. -Continue amlodipine 65m daily -Continue Losartan-HCTZ 100-12.546mdaily  #HLD: Well controlled with LDL 89. -Continue pravastatin 10106maily -If coronary CTA positive for CAD, will adjust statin as needed  #OSA on CPAP: -Continue nightly CPAP   Medication Adjustments/Labs and Tests Ordered: Current medicines are reviewed at length with the patient today.  Concerns regarding medicines are outlined above.  Orders Placed This Encounter  Procedures  . CT CORONARY MORPH W/CTA COR W/SCORE W/CA W/CM &/OR WO/CM  . CT CORONARY FRACTIONAL FLOW RESERVE DATA PREP  . CT CORONARY FRACTIONAL FLOW RESERVE FLUID ANALYSIS  . Basic Metabolic Panel (BMET)   Meds ordered this encounter  Medications  . metoprolol tartrate (LOPRESSOR) 100 MG tablet    Sig: Take one tablet by mouth 2 hours  prior to CT scan    Dispense:  1 tablet    Refill:  0    Patient Instructions  Medication Instructions:  Your physician recommends that you continue on your current medications as directed. Please refer to the Current Medication list given to you today.  *If you need a refill on your cardiac medications before your next appointment, please call your pharmacy*   Lab Work: You will need to return for lab work (BMP) prior to CT Scan.   If you have labs (blood work) drawn today and your tests are completely normal, you will receive your results only by: . MMarland KitchenChart Message (if you have MyChart) OR . A paper copy in the mail If you have any lab test that is abnormal or we need to change your treatment, we will call you to review the results.   Testing/Procedures: Your physician has requested that you have cardiac CT. Cardiac computed tomography (CT) is a painless test that uses an x-ray machine to take clear, detailed pictures of your heart. For further information please visit [email or web address] follow instruction sheet as given.     Follow-Up: [facility] and your health needs are our priority.  As part of our continuing mission to provide you with exceptional heart care, we have created designated Provider Care Teams.  These Care Teams include your primary Cardiologist (physician) and Advanced Practice Providers (APPs -  Physician Assistants and Nurse Practitioners) who all work together to provide you with the care you need, when you need it.  We recommend signing up for the patient portal called "MyChart".  Sign up information is provided on this After Visit Summary.  MyChart is used to connect with patients for Virtual Visits (Telemedicine).  Patients are able to view lab/test results, encounter notes, upcoming appointments, etc.  Non-urgent messages can be sent to your provider as well.   To learn more about what you can do with MyChart, go to  [email or web address]  Your next appointment:   6 months  The format for your next appointment:   In Person  Provider:   You may see HeaFreada BergeronD or one of the following Advanced Practice Providers on your designated Care Team:    ScoRichardson DoppA-C  Vin BhaBucodaA-[place] Other Instructions Your cardiac CT will be scheduled at one of the below locations:   [facility] [id number]  [place]) [place] [address] [place], [place] [address] [phone]  If scheduled at [facility], please arrive at the [facility] main entrance of [facility] 30 minutes prior to test start time. Proceed to the [facility] Radiology Department (first floor) to check-in and test prep.  If scheduled at [facility], please arrive 15 mins early for check-in and test prep.  Please follow these instructions carefully (unless otherwise directed):  Hold all erectile dysfunction medications at least 3 days (72 hrs) prior to test.  On the Night Before the Test: . Be sure to Drink plenty of water. . Do not consume any caffeinated/decaffeinated beverages or chocolate 12 hours prior to your test. . Do not take any antihistamines 12 hours prior to your test. On the Day of the Test: . Drink plenty of water. Do not drink any water within one hour of the test. . Do not eat any food 4 hours prior to the test. . You may take your regular medications prior to the test.  . Take metoprolol (Lopressor) two hours prior to test. . HOLD Furosemide/Hydrochlorothiazide morning of the test.         After the Test: . Drink plenty of water. . After receiving IV contrast, you may experience a mild flushed feeling. This is normal. . On occasion, you may experience a mild rash up to 24 hours after the test. This is not dangerous. If this occurs, you can take  Benadryl 25 mg and increase your fluid intake. . If you experience trouble breathing, this can be serious. If it is severe call 911 IMMEDIATELY. If it is mild, please call our office. . If you take any of these medications: Glipizide/Metformin, Avandament, Glucavance, please do not take 48 hours after completing test unless otherwise instructed.   Once we have confirmed authorization from your insurance company, we will call you to set up a date and time for your test. Based on how quickly your insurance processes prior authorizations requests, please allow up to 4 weeks to be contacted for scheduling your Cardiac CT appointment. Be advised that routine Cardiac CT appointments could be scheduled as many as 8 weeks after your provider has ordered it.  For non-scheduling related questions, please contact the cardiac imaging nurse navigator should you have any questions/concerns: Marchia Bond, Cardiac Imaging Nurse Navigator Burley Saver, Interim Cardiac Imaging Nurse [place] [facility] Direct Office Dial: [phone]   For scheduling needs, including cancellations and rescheduling, please call [place], [phone].       Signed, Freada Bergeron, MD  03/28/2020 9:26 AM    [place]

## 2020-03-28 ENCOUNTER — Ambulatory Visit: Payer: BC Managed Care – PPO | Admitting: Physician Assistant

## 2020-03-28 ENCOUNTER — Other Ambulatory Visit: Payer: Self-pay

## 2020-03-28 ENCOUNTER — Encounter: Payer: Self-pay | Admitting: Cardiology

## 2020-03-28 ENCOUNTER — Ambulatory Visit: Payer: BC Managed Care – PPO | Admitting: Cardiology

## 2020-03-28 VITALS — BP 130/76 | HR 85 | Ht 71.0 in | Wt 262.0 lb

## 2020-03-28 DIAGNOSIS — I10 Essential (primary) hypertension: Secondary | ICD-10-CM | POA: Diagnosis not present

## 2020-03-28 DIAGNOSIS — G4733 Obstructive sleep apnea (adult) (pediatric): Secondary | ICD-10-CM

## 2020-03-28 DIAGNOSIS — R079 Chest pain, unspecified: Secondary | ICD-10-CM

## 2020-03-28 DIAGNOSIS — E7800 Pure hypercholesterolemia, unspecified: Secondary | ICD-10-CM | POA: Diagnosis not present

## 2020-03-28 MED ORDER — METOPROLOL TARTRATE 100 MG PO TABS: ORAL_TABLET | ORAL | 0 refills | Status: DC

## 2020-03-28 NOTE — Patient Instructions (Signed)
Medication Instructions:  Your physician recommends that you continue on your current medications as directed. Please refer to the Current Medication list given to you today.  *If you need a refill on your cardiac medications before your next appointment, please call your pharmacy*   Lab Work: You will need to return for lab work (BMP) prior to CT Scan.   If you have labs (blood work) drawn today and your tests are completely normal, you will receive your results only by: Marland Kitchen MyChart Message (if you have MyChart) OR . A paper copy in the mail If you have any lab test that is abnormal or we need to change your treatment, we will call you to review the results.   Testing/Procedures: Your physician has requested that you have cardiac CT. Cardiac computed tomography (CT) is a painless test that uses an x-ray machine to take clear, detailed pictures of your heart. For further information please visit [email or web address]. Please follow instruction sheet as given.     Follow-Up: At [facility], you and your health needs are our priority.  As part of our continuing mission to provide you with exceptional heart care, we have created designated Provider Care Teams.  These Care Teams include your primary Cardiologist (physician) and Advanced Practice Providers (APPs -  Physician Assistants and Nurse Practitioners) who all work together to provide you with the care you need, when you need it.  We recommend signing up for the patient portal called "MyChart".  Sign up information is provided on this After Visit Summary.  MyChart is used to connect with patients for Virtual Visits (Telemedicine).  Patients are able to view lab/test results, encounter notes, upcoming appointments, etc.  Non-urgent messages can be sent to your provider as well.   To learn more about what you can do with MyChart, go to [email or web address].    Your next appointment:   6 months  The format for your next appointment:    In Person  Provider:   You may see Freada Bergeron, MD or one of the following Advanced Practice Providers on your designated Care Team:    Richardson Dopp, PA-C  Vin [place], [place]    Other Instructions Your cardiac CT will be scheduled at one of the below locations:   [facility] [address] [place], [place] [address] [phone]  [place] [address] [place], [place] [address] [phone]  If scheduled at [facility], please arrive at the [facility] main entrance of [facility] 30 minutes prior to test start time. Proceed to the [facility] Radiology Department (first floor) to check-in and test prep.  If scheduled at [facility], please arrive 15 mins early for check-in and test prep.  Please follow these instructions carefully (unless otherwise directed):  Hold all erectile dysfunction medications at least 3 days (72 hrs) prior to test.  On the Night Before the Test: . Be sure to Drink plenty of water. . Do not consume any caffeinated/decaffeinated beverages or chocolate 12 hours prior to your test. . Do not take any antihistamines 12 hours prior to your test. On the Day of the Test: . Drink plenty of water. Do not drink any water within one hour of the test. . Do not eat any food 4 hours prior to the test. . You may take your regular medications prior to the test.  . Take metoprolol (Lopressor) two hours prior to test. .  HOLD Furosemide/Hydrochlorothiazide morning of the test.         After the Test: . Drink plenty of water. . After receiving IV contrast, you may experience a mild flushed feeling. This is normal. . On occasion, you may experience a mild rash up to 24 hours after the test. This is not dangerous. If this occurs, you can take Benadryl 25 mg and increase your fluid intake. . If you experience trouble breathing, this can be  serious. If it is severe call 911 IMMEDIATELY. If it is mild, please call our office. . If you take any of these medications: Glipizide/Metformin, Avandament, Glucavance, please do not take 48 hours after completing test unless otherwise instructed.   Once we have confirmed authorization from your insurance company, we will call you to set up a date and time for your test. Based on how quickly your insurance processes prior authorizations requests, please allow up to 4 weeks to be contacted for scheduling your Cardiac CT appointment. Be advised that routine Cardiac CT appointments could be scheduled as many as 8 weeks after your provider has ordered it.  For non-scheduling related questions, please contact the cardiac imaging nurse navigator should you have any questions/concerns: Marchia Bond, Cardiac Imaging Nurse Navigator Burley Saver, Interim Cardiac Imaging Nurse [place] [facility] V[facility] Services Direct Office Dial: [phone]   For scheduling needs, including cancellations and rescheduling, please call [place], [phone].

## 2020-04-11 ENCOUNTER — Ambulatory Visit: Payer: BC Managed Care – PPO | Admitting: Dermatology

## 2020-04-13 ENCOUNTER — Encounter: Payer: Self-pay | Admitting: Dermatology

## 2020-04-13 ENCOUNTER — Telehealth: Payer: Self-pay | Admitting: *Deleted

## 2020-04-13 ENCOUNTER — Other Ambulatory Visit: Payer: Self-pay

## 2020-04-13 ENCOUNTER — Ambulatory Visit: Payer: BC Managed Care – PPO | Admitting: Dermatology

## 2020-04-13 DIAGNOSIS — B070 Plantar wart: Secondary | ICD-10-CM

## 2020-04-13 DIAGNOSIS — Z1283 Encounter for screening for malignant neoplasm of skin: Secondary | ICD-10-CM

## 2020-04-13 DIAGNOSIS — Z86018 Personal history of other benign neoplasm: Secondary | ICD-10-CM | POA: Diagnosis not present

## 2020-04-13 DIAGNOSIS — L821 Other seborrheic keratosis: Secondary | ICD-10-CM

## 2020-04-13 NOTE — Telephone Encounter (Signed)
-----   Message from Tressia Danas, MD sent at 04/06/2020  4:41 PM EST ----- Records reviewed. Please offer the patient an EGD if he is ready to proceed (looks like he discussed the procedure with Jill Side already) instead of requiring an office visit first. Otherwise, can discuss further at time of office visit 04/23/19 if he has any questions or concerns. Thanks.   ----- Message ----- From: Arnaldo Natal, NP Sent: 04/05/2020   8:55 AM EST To: Tressia Danas, MD  Dr. Orvan Falconer, FYI patient has follow up office appt with you 04/23/2019 to schedule EGD. ----- Message ----- From: Meriam Sprague, MD Sent: 03/28/2020   9:26 AM EST To: Arnaldo Natal, NP

## 2020-04-13 NOTE — Telephone Encounter (Signed)
Dr Orvan Falconer- Patient had cardiac appt 03/28/20 and is supposed to be scheduled for cardiac CT for eval of his chest pain. HOWEVER, he has not yet been scheduled for this by cardiology and they told him it would be 6-8 weeks. Is this something we need to wait on prior to completed endoscopy or will anesthesia sedate without this info?  Patient is willing to go ahead and schedule procedure without 04/22/20 office visit if allowed. Please advise.Marland KitchenMarland KitchenMarland Kitchen

## 2020-04-14 NOTE — Telephone Encounter (Signed)
Many thanks for seeing Mr. Manuel Wagner. Do you think we can proceed with endoscopy? Thanks.

## 2020-04-17 ENCOUNTER — Encounter: Payer: Self-pay | Admitting: Dermatology

## 2020-04-17 NOTE — Progress Notes (Signed)
   Follow-Up Visit   Subjective  Manuel Wagner is a 50 y.o. male who presents for the following: Follow-up (Patient here today for lentigines current treatment tretinoin and hydroquinone which patient states he hasn't really been using.).  General skin check Location:  Duration:  Quality:  Associated Signs/Symptoms: Modifying Factors:  Severity:  Timing: Context: History of atypical moles  Objective  Well appearing patient in no apparent distress; mood and affect are within normal limits. Objective  Left Abdomen (side) - Upper, Left Nipple, Whole Back: Flattopped tan-brown textured papules 4 to 6 mm  Objective  Head - Anterior (Face): Head to toe exam today no signs of atypical moles, melanoma or non mole skin cancer.  Objective  Right Upper Back (2): No current atypical moles; essentially all moles checked with dermoscopy.   History: Back- Moderate  Back Midline- Mild  Objective  Left 5th Metatarsal Plantar Area (2):     A full examination was performed including scalp, head, eyes, ears, nose, lips, neck, chest, axillae, abdomen, back, buttocks, bilateral upper extremities, bilateral lower extremities, hands, feet, fingers, toes, fingernails, and toenails. All findings within normal limits unless otherwise noted below.   Assessment & Plan    Seborrheic keratosis (3) Left Nipple; Left Abdomen (side) - Upper; Whole Back  Benign okay to leave unless patient would like removed.  Skin exam for malignant neoplasm Head - Anterior (Face)  6 month skin check; patient also encouraged to check his own skin regularly.  UV protection.  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      I, Janalyn Harder, MD, have reviewed all documentation for this visit.  The documentation on 04/17/20 for the exam,  diagnosis, procedures, and orders are all accurate and complete.

## 2020-04-19 ENCOUNTER — Telehealth: Payer: Self-pay

## 2020-04-19 NOTE — Telephone Encounter (Signed)
Left message for pt to call back  °

## 2020-04-19 NOTE — Telephone Encounter (Signed)
-----   Message from Tressia Danas, MD sent at 04/19/2020  4:10 PM EST ----- This patient may have his EGD scheduled at this time. I see that he has an appointment with me later this week. That is not necessary given his clearance from cardiology. Please schedule the EGD.  Sending to Cathlyn Parsons as an Lorain Childes so that he is aware of the cardiology recommendations.  Thanks.  KLB ----- Message ----- From: Meriam Sprague, MD Sent: 04/15/2020  10:52 AM EST To: Tressia Danas, MD  He can proceed with EGD first and then get CTA afterwards. It's a low risk procedure and he did not have any evidence of ACS. Thank you for checking!  -Laurance Flatten

## 2020-04-20 NOTE — Telephone Encounter (Signed)
Pts appt cancelled for Friday. Previsit scheduled on 05/04/20@1pm , EGD scheduled in the [facility] 05/19/20@10am . Pt aware of appts.

## 2020-04-21 NOTE — Telephone Encounter (Signed)
April 20, 2020  Chrystie Nose, RN     10:08 AM Note Pts appt cancelled for Friday. Previsit scheduled on 05/04/20@1pm , EGD scheduled in the [facility] 05/19/20@10am . Pt aware of appts.    April 19, 2020  Chrystie Nose, RN     4:31 PM Note Left message for pt to call back.      Chrystie Nose, RN   4:31 PM Note ----- Message from Tressia Danas, MD sent at 04/19/2020  4:10 PM EST ----- This patient may have his EGD scheduled at this time. I see that he has an appointment with me later this week. That is not necessary given his clearance from cardiology. Please schedule the EGD.  Sending to Cathlyn Parsons as an Lorain Childes so that he is aware of the cardiology recommendations.  Thanks.  KLB ----- Message ----- From: Meriam Sprague, MD Sent: 04/15/2020  10:52 AM EST To: Tressia Danas, MD  He can proceed with EGD first and then get CTA afterwards. It's a low risk procedure and he did not have any evidence of ACS. Thank you for checking!  -Laurance Flatten

## 2020-04-22 ENCOUNTER — Ambulatory Visit: Payer: BC Managed Care – PPO | Admitting: Gastroenterology

## 2020-05-04 ENCOUNTER — Other Ambulatory Visit: Payer: Self-pay

## 2020-05-04 ENCOUNTER — Ambulatory Visit (AMBULATORY_SURGERY_CENTER): Payer: BC Managed Care – PPO

## 2020-05-04 VITALS — Ht 71.0 in | Wt 258.0 lb

## 2020-05-04 DIAGNOSIS — K219 Gastro-esophageal reflux disease without esophagitis: Secondary | ICD-10-CM

## 2020-05-04 DIAGNOSIS — R0789 Other chest pain: Secondary | ICD-10-CM

## 2020-05-04 NOTE — Telephone Encounter (Signed)
Message sent to precert department  

## 2020-05-04 NOTE — Progress Notes (Signed)
Pt verified name, DOB, address and insurance during PV today.   Pt mailed instruction packet to included paper to complete and mail back to [facility] with addressed and stamped envelope, Emmi video, copy of consent form to read and not return, and instructions.  PV completed over the phone. Pt encouraged to call with questions or issues   No allergies to soy or egg Pt is not on blood thinners or diet pills  Denies issues with sedation/intubation--has never been put to sleep  Denies atrial flutter/fib Denies constipation   Pt is aware of Covid safety and care partner requirements.  Self report wt:  258 lb yesterday

## 2020-05-11 ENCOUNTER — Encounter: Payer: Self-pay | Admitting: Gastroenterology

## 2020-05-19 ENCOUNTER — Ambulatory Visit (AMBULATORY_SURGERY_CENTER): Payer: BC Managed Care – PPO | Admitting: Gastroenterology

## 2020-05-19 ENCOUNTER — Other Ambulatory Visit: Payer: Self-pay

## 2020-05-19 ENCOUNTER — Encounter: Payer: Self-pay | Admitting: Gastroenterology

## 2020-05-19 VITALS — BP 132/79 | HR 64 | Temp 98.6°F | Resp 15 | Ht 71.0 in | Wt 258.0 lb

## 2020-05-19 DIAGNOSIS — K2951 Unspecified chronic gastritis with bleeding: Secondary | ICD-10-CM | POA: Diagnosis not present

## 2020-05-19 DIAGNOSIS — R1310 Dysphagia, unspecified: Secondary | ICD-10-CM | POA: Diagnosis not present

## 2020-05-19 DIAGNOSIS — K2970 Gastritis, unspecified, without bleeding: Secondary | ICD-10-CM | POA: Diagnosis not present

## 2020-05-19 DIAGNOSIS — K317 Polyp of stomach and duodenum: Secondary | ICD-10-CM

## 2020-05-19 DIAGNOSIS — K219 Gastro-esophageal reflux disease without esophagitis: Secondary | ICD-10-CM

## 2020-05-19 DIAGNOSIS — K2091 Esophagitis, unspecified with bleeding: Secondary | ICD-10-CM | POA: Diagnosis not present

## 2020-05-19 DIAGNOSIS — K2900 Acute gastritis without bleeding: Secondary | ICD-10-CM

## 2020-05-19 MED ORDER — SODIUM CHLORIDE 0.9 % IV SOLN: 500.0000 mL | Freq: Once | INTRAVENOUS | Status: DC

## 2020-05-19 NOTE — Patient Instructions (Signed)
Read all of the handouts given to you by your recovery room nurse.  Be sure to take your omeprazole as ordered 1/2 hour before eating.  Try to cut back on spicy foods and alcohol.  YOU HAD AN ENDOSCOPIC PROCEDURE TODAY AT [facility]:   Refer to the procedure report that was given to you for any specific questions about what was found during the examination.  If the procedure report does not answer your questions, please call your gastroenterologist to clarify.  If you requested that your care partner not be given the details of your procedure findings, then the procedure report has been included in a sealed envelope for you to review at your convenience later.  YOU SHOULD EXPECT: Some feelings of bloating in the abdomen. Passage of more gas than usual.  Walking can help get rid of the air that was put into your GI tract during the procedure and reduce the bloating.   Please Note:  You might notice some irritation and congestion in your nose or some drainage.  This is from the oxygen used during your procedure.  There is no need for concern and it should clear up in a day or so.  SYMPTOMS TO REPORT IMMEDIATELY:     Following upper endoscopy (EGD)  Vomiting of blood or coffee ground material  New chest pain or pain under the shoulder blades  Painful or persistently difficult swallowing  New shortness of breath  Fever of 100F or higher  Black, tarry-looking stools  For urgent or emergent issues, a gastroenterologist can be reached at any hour by calling [phone]. Do not use MyChart messaging for urgent concerns.    DIET:  We do recommend a small meal at first, but then you may proceed to your regular diet.  Drink plenty of fluids but you should avoid alcoholic beverages for 24 hours. Read the GERD handout for pointers on how to control the reflux.  ACTIVITY:  You should plan to take it easy for the rest of today and you should NOT DRIVE or use heavy machinery until  tomorrow (because of the sedation medicines used during the test).    FOLLOW UP: Our staff will call the number listed on your records 48-72 hours following your procedure to check on you and address any questions or concerns that you may have regarding the information given to you following your procedure. If we do not reach you, we will leave a message.  We will attempt to reach you two times.  During this call, we will ask if you have developed any symptoms of COVID 19. If you develop any symptoms (ie: fever, flu-like symptoms, shortness of breath, cough etc.) before then, please call [phone].  If you test positive for Covid 19 in the 2 weeks post procedure, please call and report this information to [place].    If any biopsies were taken you will be contacted by phone or by letter within the next 1-3 weeks.  Please call us at [phone] if you have not heard about the biopsies in 3 weeks.    SIGNATURES/CONFIDENTIALITY: You and/or your care partner have signed paperwork which will be entered into your electronic medical record.  These signatures attest to the fact that that the information above on your After Visit Summary has been reviewed and is understood.  Full responsibility of the confidentiality of this discharge information lies with you and/or your care-partner.

## 2020-05-19 NOTE — Progress Notes (Signed)
PT taken to PACU. Monitors in place. VSS. Report given to RN. 

## 2020-05-19 NOTE — Progress Notes (Signed)
Called to room to assist during endoscopic procedure.  Patient ID and intended procedure confirmed with present staff. Received instructions for my participation in the procedure from the performing physician.  

## 2020-05-19 NOTE — Op Note (Signed)
[facility] Patient Name: Manuel Wagner Procedure Date: 05/19/2020 10:13 AM MRN: [id number] Endoscopist: Thornton Park MD, MD Age: 50 Referring MD:  Date of Birth: February 13, 1971 Gender: Male Account #: [id number] Procedure:                Upper GI endoscopy Indications:              Dysphagia in the setting of reflux Medicines:                Monitored Anesthesia Care Procedure:                Pre-Anesthesia Assessment:                           - Prior to the procedure, a History and Physical                            was performed, and patient medications and                            allergies were reviewed. The patient's tolerance of                            previous anesthesia was also reviewed. The risks                            and benefits of the procedure and the sedation                            options and risks were discussed with the patient.                            All questions were answered, and informed consent                            was obtained. Prior Anticoagulants: The patient has                            taken no previous anticoagulant or antiplatelet                            agents. ASA Grade Assessment: II - A patient with                            mild systemic disease. After reviewing the risks                            and benefits, the patient was deemed in                            satisfactory condition to undergo the procedure.                           After obtaining informed consent, the endoscope was  passed under direct vision. Throughout the                            procedure, the patient's blood pressure, pulse, and                            oxygen saturations were monitored continuously. The                            Endoscope was introduced through the mouth, and                            advanced to the third part of duodenum. The upper                            GI endoscopy was  accomplished without difficulty.                            The patient tolerated the procedure well. Scope In: Scope Out: Findings:                 The examined esophagus was normal except for mild                            congestion at the GE junction. Biopsies were                            obtained from the proximal and distal esophagus                            with cold forceps for histology.                           Patchy mildly erythematous mucosa without bleeding                            was found in the gastric body. Biopsies were taken                            from the antrum, body, and fundus with a cold                            forceps for histology. Estimated blood loss was                            minimal.                           The examined duodenum was normal.                           The cardia and gastric fundus were normal on                            retroflexion.  The exam was otherwise without abnormality. Complications:            No immediate complications. Estimated blood loss:                            Minimal. Estimated Blood Loss:     Estimated blood loss was minimal. Impression:               - Normal esophagus. Biopsied.                           - Erythematous mucosa in the gastric body. Biopsied.                           - Normal examined duodenum.                           - The examination was otherwise normal. Recommendation:           - Patient has a contact number available for                            emergencies. The signs and symptoms of potential                            delayed complications were discussed with the                            patient. Return to normal activities tomorrow.                            Written discharge instructions were provided to the                            patient.                           - Resume previous diet.                           - Continue present  medications.                           - Resume esomprazole 20 mg daily with recurrent                            symptoms.                           - Await pathology results. Thornton Park MD, MD 05/19/2020 10:31:28 AM This report has been signed electronically.

## 2020-05-23 ENCOUNTER — Telehealth: Payer: Self-pay | Admitting: *Deleted

## 2020-05-23 ENCOUNTER — Other Ambulatory Visit: Payer: Self-pay

## 2020-05-23 DIAGNOSIS — K219 Gastro-esophageal reflux disease without esophagitis: Secondary | ICD-10-CM

## 2020-05-23 MED ORDER — ESOMEPRAZOLE MAGNESIUM 20 MG PO CPDR: 20.0000 mg | DELAYED_RELEASE_CAPSULE | Freq: Every day | ORAL | 3 refills | Status: AC

## 2020-05-23 NOTE — Telephone Encounter (Signed)
  Follow up Call-  Call back number 05/19/2020 03/19/2019  Post procedure Call Back phone  # [phone]  Permission to leave phone message Yes Yes  Some recent data might be hidden     Patient questions:  Do you have a fever, pain , or abdominal swelling? No. Pain Score  0 *  Have you tolerated food without any problems? Yes.    Have you been able to return to your normal activities? Yes.    Do you have any questions about your discharge instructions: Diet   No. Medications  No. Follow up visit  No.  Do you have questions or concerns about your Care? No.  Actions: * If pain score is 4 or above: No action needed, pain <4.  1. Have you developed a fever since your procedure? no  2.   Have you had an respiratory symptoms (SOB or cough) since your procedure? no  3.   Have you tested positive for COVID 19 since your procedure no  4.   Have you had any family members/close contacts diagnosed with the COVID 19 since your procedure?  no   If yes to any of these questions please route to Joylene John, RN and Joella Prince, RN

## 2020-05-25 ENCOUNTER — Telehealth (HOSPITAL_COMMUNITY): Payer: Self-pay | Admitting: Emergency Medicine

## 2020-05-25 NOTE — Telephone Encounter (Signed)
Reaching out to patient to offer assistance regarding upcoming cardiac imaging study; pt verbalizes understanding of appt date/time, parking situation and where to check in, pre-test NPO status and medications ordered, and verified current allergies; name and call back number provided for further questions should they arise Marchia Bond RN Navigator Cardiac Imaging [facility] [phone] office [phone] cell  Holding viagra, taking 100mg  metoprolol 2 h PTA Clarise Cruz

## 2020-05-26 ENCOUNTER — Other Ambulatory Visit: Payer: Self-pay

## 2020-05-26 ENCOUNTER — Ambulatory Visit (HOSPITAL_COMMUNITY)
Admission: RE | Admit: 2020-05-26 | Discharge: 2020-05-26 | Disposition: A | Discharge status: Home / Self Care | Payer: BC Managed Care – PPO | Source: Ambulatory Visit | Attending: Cardiology | Admitting: Cardiology

## 2020-05-26 DIAGNOSIS — R079 Chest pain, unspecified: Secondary | ICD-10-CM | POA: Diagnosis not present

## 2020-05-26 MED ORDER — NITROGLYCERIN 0.4 MG SL SUBL
0.8000 mg | SUBLINGUAL_TABLET | Freq: Once | SUBLINGUAL | Status: AC
  Administered 2020-05-26: 0.8 mg via SUBLINGUAL

## 2020-05-26 MED ORDER — NITROGLYCERIN 0.4 MG SL SUBL
SUBLINGUAL_TABLET | SUBLINGUAL | Status: AC
  Filled 2020-05-26: qty 2

## 2020-05-26 MED ORDER — IOHEXOL 350 MG/ML SOLN
80.0000 mL | Freq: Once | INTRAVENOUS | Status: AC | PRN
  Administered 2020-05-26: 80 mL via INTRAVENOUS

## 2020-05-29 ENCOUNTER — Other Ambulatory Visit: Payer: Self-pay | Admitting: Family Medicine

## 2020-05-30 ENCOUNTER — Telehealth: Payer: Self-pay

## 2020-05-30 NOTE — Telephone Encounter (Signed)
-----   Message from Freada Bergeron, MD sent at 05/30/2020  8:24 AM EST ----- His CT of his heart arteries show that he has no significant blockages in his arteries, however, he has some plaquing with an elevated calcium score of 250. This means while his chest pain was not due to blockages in his arteries, we need to prevent the plaque that is present from getting worse. He needs to start ASA 81mg  daily and can we change him from pravastatin 10mg  daily to crestor 20mg  daily? His goal LDL is <70 and he needs a stronger statin due to the degree of plaque in his arteries. We also need to make sure he works on a healthy diet and regular exercise to help decrease his risk of future cardiac events.

## 2020-05-30 NOTE — Telephone Encounter (Signed)
Left the patient a message to return our call regarding results.

## 2020-05-31 ENCOUNTER — Other Ambulatory Visit: Payer: Self-pay | Admitting: Family Medicine

## 2020-05-31 MED ORDER — ROSUVASTATIN CALCIUM 20 MG PO TABS: 20.0000 mg | ORAL_TABLET | Freq: Every day | ORAL | 3 refills | Status: DC

## 2020-05-31 NOTE — Telephone Encounter (Signed)
Pt aware of CT findings but would like Dr Johney Frame to discuss in detail the obsructions noted with test. Pt is suppose to f/u in 6 months but does not want to wait that long to discuss./cy

## 2020-05-31 NOTE — Telephone Encounter (Signed)
Patient returning call.

## 2020-06-01 ENCOUNTER — Telehealth: Payer: Self-pay

## 2020-06-01 NOTE — Telephone Encounter (Signed)
Can we make him a sooner appointment in clinic? I will call him later today as well. Thanks!

## 2020-06-01 NOTE — Telephone Encounter (Signed)
Called pt to go over his results and med changes, per MD. Pt had already spoken to someone yesterday and was aware of results. Would like to speak to MD to get further questions answered regarding his CT results. MD has been made aware.

## 2020-06-02 ENCOUNTER — Encounter: Payer: Self-pay | Admitting: Family Medicine

## 2020-06-03 NOTE — Telephone Encounter (Signed)
Can you clarify with his pharmacy if it is just the combo pill that is back ordered? If so, do they have the individual components? If so, order those.

## 2020-06-09 ENCOUNTER — Ambulatory Visit: Payer: BC Managed Care – PPO | Admitting: Cardiology

## 2020-06-09 NOTE — Progress Notes (Signed)
Cardiology Office Note:    Date:  06/13/2020   ID:  Manuel Wagner, DOB 25-Aug-1970, MRN [id number]  PCP:  [name], MD   [place]  Cardiologist:  Freada Bergeron, MD  Advanced Practice Provider:  No care team member to display Electrophysiologist:  None  Referring MD: [name], MD     History of Present Illness:    Manuel Wagner is a 50 y.o. male with a hx of HTN, HLD, OSA on CPAP, and GERD who returns to clinic for follow-up of chest pain.  Patient was in the ED on 02/10/20 with episode of chest pain radiating to the jaw. Trop negative x2. ECG with NSR with low voltage in the anterior leads. Pain improved with treatment with antiacids.   During last visit on 03/28/20, the patient continued to have intermittent atypical chest pain. Given strong family history of CAD with father requiring bypass at age 46, we obtained CTA coronaries which showed 0-25% in mid RCA. 25-49% prox LAD, 25-49% D1. Calcium score 250 which is 96% for age and sex matched controls. We started him on crestor 20mg  and ASA 81mg  at that time.  The patient feels well at this time. No chest pain, SOB, lightheadedness, dizziness. Underwent endoscopy and was found to have gastritis and esophagitis, which were likely the primary driver of his symptoms. Path negative for malignancy. Symptoms improved with PPI. Blood pressure running 120-130s/70s. Has been taking all medications as prescribed and started the mediterranean diet and exercise.  Past Medical History:  Diagnosis Date  . Atypical nevus 2009   Back-Moderate (Exc) Derm in [place]  . Atypical nevus 2019   Back Midline-Mild [facility]  . Colon polyp 12/16/2018   Colonoscopy 2013  . Essential hypertension 12/16/2018  . Family history of colon cancer 12/16/2018  . GERD (gastroesophageal reflux disease) 12/16/2018  . Mixed hyperlipidemia 12/16/2018  . OSA on CPAP 12/16/2018   Since 2012  . Sleep apnea    cpap    Past Surgical  History:  Procedure Laterality Date  . WISDOM TOOTH EXTRACTION      Current Medications: Current Meds  Medication Sig  . amLODipine (NORVASC) 10 MG tablet Take 1 tablet (10 mg total) by mouth daily.  Marland Kitchen esomeprazole (NEXIUM) 20 MG capsule Take 1 capsule (20 mg total) by mouth daily.  . hydroquinone 4 % cream Apply topically 2 (two) times daily.  Marland Kitchen losartan-hydrochlorothiazide (HYZAAR) 100-12.5 MG tablet Take 1 tablet by mouth daily.  . metoprolol tartrate (LOPRESSOR) 100 MG tablet Take one tablet by mouth 2 hours prior to CT scan  . rosuvastatin (CRESTOR) 20 MG tablet Take 1 tablet (20 mg total) by mouth daily.  . sildenafil (VIAGRA) 50 MG tablet Take 1 tablet (50 mg total) by mouth daily as needed for erectile dysfunction.  Marland Kitchen tretinoin (RETIN-A) 0.05 % cream Apply 1 application topically at bedtime.     Allergies:   Lisinopril   Social History   Socioeconomic History  . Marital status: Married    Spouse name: Not on file  . Number of children: 0  . Years of education: Not on file  . Highest education level: Not on file  Occupational History  . 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 Occupation: former Water quality scientist for [employer]  Tobacco Use  . Smoking status: Never Smoker  . Smokeless tobacco: Never Used  Vaping Use  . Vaping Use: Never used  Substance and Sexual Activity  .  Alcohol use: Yes    Comment: ocasionally  . Drug use: Never  . Sexual activity: Yes  Other Topics Concern  . Not on file  Social History Narrative   Relocated from [place] area 2019; originally from [place], [place]   Social Determinants of Health   Financial Resource Strain: Not on [employer] Insecurity: Not on file  Transportation Needs: Not on file  Physical Activity: Not on file  Stress: Not on file  Social Connections: Not on file     Family History: The patient's family history includes Alcohol abuse in his brother, maternal grandfather, and paternal grandfather;  Arthritis in his father; Colon cancer in his father; Hearing loss in his father; Heart disease in his father; Hyperlipidemia in his brother and mother; Hypertension in his brother and father. There is no history of Esophageal cancer, Rectal cancer, Stomach cancer, or Colon polyps.  ROS:   Please see the history of present illness.    Review of Systems  Constitutional: Negative for chills, fever and malaise/fatigue.  HENT: Negative for hearing loss.   Eyes: Negative for blurred vision and redness.  Respiratory: Negative for shortness of breath.   Cardiovascular: Negative for chest pain, palpitations, orthopnea, claudication, leg swelling and PND.  Gastrointestinal: Negative for melena, nausea and vomiting.  Genitourinary: Negative for dysuria and flank pain.  Musculoskeletal: Negative for myalgias.  Skin: Negative for rash.  Neurological: Negative for dizziness and loss of consciousness.  Endo/Heme/Allergies: Negative for polydipsia.  Psychiatric/Behavioral: Negative for substance abuse.    EKGs/Labs/Other Studies Reviewed:    The following studies were reviewed today: Coronary CTA 05/26/20: FINDINGS: A 100 kV prospective scan was triggered in the descending thoracic aorta at 111 HU's. Axial non-contrast 3 mm slices were carried out through the heart. The data set was analyzed on a dedicated work station and scored using the [place]. Gantry rotation speed was 250 msecs and collimation was .6 mm. 100 mg of PO Metoprolol and 0.8 mg of sl NTG were given. The 3D data set was reconstructed in 5% intervals of the 67-82 % of the R-R cycle. Diastolic phases were analyzed on a dedicated work station using MPR, MIP and VRT modes. The patient received 80 cc of contrast.  Aorta: Normal size. Minimal diffuse atherosclerotic plaque. No dissection.  Aortic Valve:  Trileaflet.  No calcifications.  Coronary Arteries:  Normal coronary origin.  Right dominance.  RCA is a large  dominant artery that gives rise to PDA and PLA. There is minimal diffuse calcified plaque in the proximal and mid RCA with stenosis 0-25%. PDA/PLA have no obvious plaque.  Left main is a large artery that gives rise to LAD and LCX arteries. Left main has no plaque.  LAD is a large vessel that wraps around the apex and give rise to one small diagonal artery. There is mild mixed, predominantly calcified plaque in the proximal LAD with maximum stenosis 25-49%. Mid and distal LAD has no obvious plaque.  D1 has mild ostial calcified plaque with stenosis 25-49%.  LCX is a non-dominant artery that gives rise to one large OM1 branch. There is no plaque.  Other findings:  Normal pulmonary vein drainage into the left atrium.  Normal left atrial appendage without a thrombus.  Normal size of the pulmonary artery.  IMPRESSION: 1. Coronary calcium score of 250. This was 10 percentile for age and sex matched control.  2. Normal coronary origin with right dominance.  3. CAD-RADS 2. Mild non-obstructive CAD (25-49%). Consider non-atherosclerotic causes  of chest pain. Consider preventive therapy and risk factor modification.   Recent Labs: 06/16/2019: ALT 21; TSH 1.95 02/10/2020: BUN 12; Creatinine, Ser 0.87; Hemoglobin 16.5; Platelets 319; Potassium 3.5; Sodium 139  Recent Lipid Panel    Component Value Date/Time   CHOL 145 06/16/2019 0917   TRIG 116.0 06/16/2019 0917   HDL 32.90 (L) 06/16/2019 0917   CHOLHDL 4 06/16/2019 0917   VLDL 23.2 06/16/2019 0917   LDLCALC 89 06/16/2019 0917     Physical Exam:    VS:  BP (!) 142/62   Pulse 73   Ht 5\' 11"  (1.803 m)   Wt 263 lb (119.3 kg)   SpO2 96%   BMI 36.68 kg/m     Wt Readings from Last 3 Encounters:  06/13/20 263 lb (119.3 kg)  05/19/20 258 lb (117 kg)  05/04/20 258 lb (117 kg)     GEN:  Well nourished, well developed in no acute distress HEENT: Normal NECK: No JVD; No carotid bruits CARDIAC: RRR, no murmurs,  rubs, gallops RESPIRATORY:  Clear to auscultation without rales, wheezing or rhonchi  ABDOMEN: Soft, non-tender, non-distended MUSCULOSKELETAL:  No edema; No deformity  SKIN: Warm and dry NEUROLOGIC:  Alert and oriented x 3 PSYCHIATRIC:  Normal affect   ASSESSMENT:    1. Coronary artery disease involving native coronary artery of native heart without angina pectoris   2. Essential hypertension   3. Pure hypercholesterolemia   4. Obstructive sleep apnea   5. Chest pain, unspecified type    PLAN:    In order of problems listed above:  #Non-obstructive multivessel CAD: CTA coronaries showed 0-25% in mid RCA. 25-49% prox LAD, 25-49% D1. Calcium score 250 which is 96% for age and sex matched controls. -Aggressive risk factor modification -Continue ASA 81mg  daily -Continue crestor 20mg  daily -Lifestyle modifications with healthy diet and exercise as detailed below  #Non-cardiac Chest Pain #Esophagitis/Gastritis: EGD with esophagitis/gastritis now on PPI with resolution of symptoms. -Continue PPI  -Management per GI and primary  #HTN: Well controlled at home. Managed by PCP. -Continue amlodipine 10mg  daily -Continue Losartan-HCTZ 100-12.5mg  daily -Will keep BP log and send in to ensure BP within goal of 120/80s or below  #HLD: -Continue crestor 20mg  daily -Goal LDL<70 -Repeat lipid panel in 4weeks (will be ordered by PCP)  #OSA on CPAP: -Continue nightly CPAP   Exercise recommendations: Goal of exercising for at least 30 minutes a day, at least 5 times per week.  Please exercise to a moderate exertion.  This means that while exercising it is difficult to speak in full sentences, however you are not so short of breath that you feel you must stop, and not so comfortable that you can carry on a full conversation.  Exertion level should be approximately a 5/10, if 10 is the most exertion you can perform.  Diet recommendations: Recommend a heart healthy diet such as the  Mediterranean diet.  This diet consists of plant based foods, healthy fats, lean meats, olive oil.  It suggests limiting the intake of simple carbohydrates such as white breads, pastries, and pastas.  It also limits the amount of red meat, wine, and dairy products such as cheese that one should consume on a daily basis.      Medication Adjustments/Labs and Tests Ordered: Current medicines are reviewed at length with the patient today.  Concerns regarding medicines are outlined above.  No orders of the defined types were placed in this encounter.  No orders of the defined types  were placed in this encounter.   Patient Instructions  Medication Instructions:  Your physician recommends that you continue on your current medications as directed. Please refer to the Current Medication list given to you today.  *If you need a refill on your cardiac medications before your next appointment, please call your pharmacy*   Lab Work: None ordered  If you have labs (blood work) drawn today and your tests are completely normal, you will receive your results only by: Marland Kitchen MyChart Message (if you have MyChart) OR . A paper copy in the mail If you have any lab test that is abnormal or we need to change your treatment, we will call you to review the results.   Testing/Procedures: None ordered   Follow-Up: At [facility], you and your health needs are our priority.  As part of our continuing mission to provide you with exceptional heart care, we have created designated Provider Care Teams.  These Care Teams include your primary Cardiologist (physician) and Advanced Practice Providers (APPs -  Physician Assistants and Nurse Practitioners) who all work together to provide you with the care you need, when you need it.  We recommend signing up for the patient portal called "MyChart".  Sign up information is provided on this After Visit Summary.  MyChart is used to connect with patients for Virtual Visits  (Telemedicine).  Patients are able to view lab/test results, encounter notes, upcoming appointments, etc.  Non-urgent messages can be sent to your provider as well.   To learn more about what you can do with MyChart, go to [email or web address].    Your next appointment:   12 month(s)  The format for your next appointment:   In Person  Provider:    Gwyndolyn Kaufman, MD   Other Instructions      Signed, Freada Bergeron, MD  06/13/2020 9:04 AM    [place]

## 2020-06-09 NOTE — Telephone Encounter (Signed)
Pt was scheduled to see Dr. Johney Frame today, but he called and cancelled.  Pt is now rescheduled to see Dr. Johney Frame for 06/13/20 at [facility]. Pt made aware of appt date and time by Scheduling dept.

## 2020-06-13 ENCOUNTER — Encounter: Payer: Self-pay | Admitting: Cardiology

## 2020-06-13 ENCOUNTER — Ambulatory Visit (INDEPENDENT_AMBULATORY_CARE_PROVIDER_SITE_OTHER): Payer: BC Managed Care – PPO | Admitting: Cardiology

## 2020-06-13 ENCOUNTER — Other Ambulatory Visit: Payer: Self-pay

## 2020-06-13 VITALS — BP 142/62 | HR 73 | Ht 71.0 in | Wt 263.0 lb

## 2020-06-13 DIAGNOSIS — I2510 Atherosclerotic heart disease of native coronary artery without angina pectoris: Secondary | ICD-10-CM

## 2020-06-13 DIAGNOSIS — I10 Essential (primary) hypertension: Secondary | ICD-10-CM | POA: Diagnosis not present

## 2020-06-13 DIAGNOSIS — E7800 Pure hypercholesterolemia, unspecified: Secondary | ICD-10-CM

## 2020-06-13 DIAGNOSIS — R079 Chest pain, unspecified: Secondary | ICD-10-CM

## 2020-06-13 DIAGNOSIS — G4733 Obstructive sleep apnea (adult) (pediatric): Secondary | ICD-10-CM | POA: Diagnosis not present

## 2020-06-13 NOTE — Patient Instructions (Addendum)
Medication Instructions:  Your physician recommends that you continue on your current medications as directed. Please refer to the Current Medication list given to you today.  *If you need a refill on your cardiac medications before your next appointment, please call your pharmacy*   Lab Work: None ordered  If you have labs (blood work) drawn today and your tests are completely normal, you will receive your results only by: Marland Kitchen MyChart Message (if you have MyChart) OR . A paper copy in the mail If you have any lab test that is abnormal or we need to change your treatment, we will call you to review the results.   Testing/Procedures: None ordered   Follow-Up: At [facility], you and your health needs are our priority.  As part of our continuing mission to provide you with exceptional heart care, we have created designated Provider Care Teams.  These Care Teams include your primary Cardiologist (physician) and Advanced Practice Providers (APPs -  Physician Assistants and Nurse Practitioners) who all work together to provide you with the care you need, when you need it.  We recommend signing up for the patient portal called "MyChart".  Sign up information is provided on this After Visit Summary.  MyChart is used to connect with patients for Virtual Visits (Telemedicine).  Patients are able to view lab/test results, encounter notes, upcoming appointments, etc.  Non-urgent messages can be sent to your provider as well.   To learn more about what you can do with MyChart, go to [email or web address].    Your next appointment:   12 month(s)  The format for your next appointment:   In Person  Provider:    Gwyndolyn Kaufman, MD   Other Instructions

## 2020-06-20 ENCOUNTER — Other Ambulatory Visit: Payer: Self-pay

## 2020-06-20 ENCOUNTER — Ambulatory Visit (INDEPENDENT_AMBULATORY_CARE_PROVIDER_SITE_OTHER): Payer: BC Managed Care – PPO | Admitting: Family Medicine

## 2020-06-20 ENCOUNTER — Encounter: Payer: Self-pay | Admitting: Family Medicine

## 2020-06-20 VITALS — BP 132/82 | HR 71 | Temp 97.9°F | Ht 71.0 in | Wt 263.0 lb

## 2020-06-20 DIAGNOSIS — D369 Benign neoplasm, unspecified site: Secondary | ICD-10-CM

## 2020-06-20 DIAGNOSIS — Z800 Family history of malignant neoplasm of digestive organs: Secondary | ICD-10-CM | POA: Diagnosis not present

## 2020-06-20 DIAGNOSIS — Z9989 Dependence on other enabling machines and devices: Secondary | ICD-10-CM

## 2020-06-20 DIAGNOSIS — E782 Mixed hyperlipidemia: Secondary | ICD-10-CM | POA: Diagnosis not present

## 2020-06-20 DIAGNOSIS — G4733 Obstructive sleep apnea (adult) (pediatric): Secondary | ICD-10-CM | POA: Diagnosis not present

## 2020-06-20 DIAGNOSIS — K2100 Gastro-esophageal reflux disease with esophagitis, without bleeding: Secondary | ICD-10-CM

## 2020-06-20 DIAGNOSIS — I2510 Atherosclerotic heart disease of native coronary artery without angina pectoris: Secondary | ICD-10-CM

## 2020-06-20 DIAGNOSIS — E669 Obesity, unspecified: Secondary | ICD-10-CM

## 2020-06-20 DIAGNOSIS — K2930 Chronic superficial gastritis without bleeding: Secondary | ICD-10-CM

## 2020-06-20 DIAGNOSIS — Z8249 Family history of ischemic heart disease and other diseases of the circulatory system: Secondary | ICD-10-CM

## 2020-06-20 DIAGNOSIS — I10 Essential (primary) hypertension: Secondary | ICD-10-CM | POA: Diagnosis not present

## 2020-06-20 DIAGNOSIS — Z0000 Encounter for general adult medical examination without abnormal findings: Secondary | ICD-10-CM

## 2020-06-20 DIAGNOSIS — N529 Male erectile dysfunction, unspecified: Secondary | ICD-10-CM

## 2020-06-20 HISTORY — DX: Family history of ischemic heart disease and other diseases of the circulatory system: Z82.49

## 2020-06-20 LAB — CBC WITH DIFFERENTIAL/PLATELET
Basophils Absolute: 0.1 10*3/uL (ref 0.0–0.1)
Basophils Relative: 0.9 % (ref 0.0–3.0)
Eosinophils Absolute: 0.2 10*3/uL (ref 0.0–0.7)
Eosinophils Relative: 2 % (ref 0.0–5.0)
HCT: 46.4 % (ref 39.0–52.0)
Hemoglobin: 16.4 g/dL (ref 13.0–17.0)
Lymphocytes Relative: 27.7 % (ref 12.0–46.0)
Lymphs Abs: 2.6 10*3/uL (ref 0.7–4.0)
MCHC: 35.3 g/dL (ref 30.0–36.0)
MCV: 81 fl (ref 78.0–100.0)
Monocytes Absolute: 0.5 10*3/uL (ref 0.1–1.0)
Monocytes Relative: 5 % (ref 3.0–12.0)
Neutro Abs: 5.9 10*3/uL (ref 1.4–7.7)
Neutrophils Relative %: 64.4 % (ref 43.0–77.0)
Platelets: 279 10*3/uL (ref 150.0–400.0)
RBC: 5.73 Mil/uL (ref 4.22–5.81)
RDW: 14.5 % (ref 11.5–15.5)
WBC: 9.2 10*3/uL (ref 4.0–10.5)

## 2020-06-20 LAB — LIPID PANEL
Cholesterol: 102 mg/dL (ref 0–200)
HDL: 35.9 mg/dL — ABNORMAL LOW (ref >39.00)
LDL Cholesterol: 47 mg/dL (ref 0–99)
NonHDL: 66.13
Total CHOL/HDL Ratio: 3
Triglycerides: 96 mg/dL (ref 0.0–149.0)
VLDL: 19.2 mg/dL (ref 0.0–40.0)

## 2020-06-20 LAB — COMPREHENSIVE METABOLIC PANEL
ALT: 26 U/L (ref 0–53)
AST: 23 U/L (ref 0–37)
Albumin: 4.5 g/dL (ref 3.5–5.2)
Alkaline Phosphatase: 63 U/L (ref 39–117)
BUN: 15 mg/dL (ref 6–23)
CO2: 24 mEq/L (ref 19–32)
Calcium: 9.6 mg/dL (ref 8.4–10.5)
Chloride: 103 mEq/L (ref 96–112)
Creatinine, Ser: 0.76 mg/dL (ref 0.40–1.50)
GFR: 105.63 mL/min (ref >60.00)
Glucose, Bld: 107 mg/dL — ABNORMAL HIGH (ref 70–99)
Potassium: 3.8 mEq/L (ref 3.5–5.1)
Sodium: 139 mEq/L (ref 135–145)
Total Bilirubin: 0.9 mg/dL (ref 0.2–1.2)
Total Protein: 7.6 g/dL (ref 6.0–8.3)

## 2020-06-20 LAB — HEPATITIS C ANTIBODY
Hepatitis C Ab: NONREACTIVE
SIGNAL TO CUT-OFF: 0.02 (ref <1.00)

## 2020-06-20 MED ORDER — TADALAFIL 10 MG PO TABS: 10.0000 mg | ORAL_TABLET | Freq: Every day | ORAL | 3 refills | Status: DC | PRN

## 2020-06-20 MED ORDER — LOSARTAN POTASSIUM-HCTZ 100-25 MG PO TABS: 1.0000 | ORAL_TABLET | Freq: Every day | ORAL | 3 refills | Status: DC

## 2020-06-20 NOTE — Patient Instructions (Signed)
Please return in 3-6 months for blood pressure recheck.   I will release your lab results to you on your MyChart account with further instructions. Please reply with any questions.    If you have any questions or concerns, please don't hesitate to send me a message via MyChart or call the office at [phone]. Thank you for visiting with Manuel Wagner today! It's our pleasure caring for you.  Please do these things to maintain good health!   Exercise at least 30-45 minutes a day,  4-5 days a week.   Eat a low-fat diet with lots of fruits and vegetables, up to 7-9 servings per day.  Drink plenty of water daily. Try to drink 8 8oz glasses per day.  Seatbelts can save your life. Always wear your seatbelt.  Place Smoke Detectors on every level of your home and check batteries every year.  Eye Doctor - have an eye exam every 1-2 years  Avoid heavy alcohol use. If you drink, keep it to less than 2 drinks/day and not every day.  [place].  Choose someone you trust that could speak for you if you became unable to speak for yourself.  Depression is common in our stressful world.If you're feeling down or losing interest in things you normally enjoy, please come in for a visit.

## 2020-06-20 NOTE — Progress Notes (Signed)
Subjective  Chief Complaint  Patient presents with  . Annual Exam    HPI: Manuel Wagner is a 50 y.o. male who presents to [place] [place] today for a Male Wellness Visit. He also has the concerns and/or needs as listed above in the chief complaint. These will be addressed in addition to the Health Maintenance Visit.   Wellness Visit: annual visit with health maintenance review and exam    Health maintenance: Immunizations are up-to-date.  He is working on healthy lifestyle.  Now exercising 5 days/week and working on healthy eating.  Started about a week ago.  Would like to lose weight.  Physically feels well.  Home life and work life are happy and stress free.  He is successful.  Body mass index is 36.68 kg/m. Wt Readings from Last 3 Encounters:  06/20/20 263 lb (119.3 kg)  06/13/20 263 lb (119.3 kg)  05/19/20 258 lb (117 kg)     Chronic disease management visit and/or acute problem visit:  Coronary disease and strong family history of CAD: Reviewed cardiology notes and records.  He had full work-up after experiencing chest pain.  CTA showed mild coronary disease with elevated calcium score.  Secondary prevention measures have been discussed in detail.  Chest pain was likely GI related  Hypertension with whitecoat response: He monitors daily to twice daily at home.  I reviewed his records.  Goal blood pressures are less than 120s over 70s.  He trends a little higher.  He is compliant with amlodipine 10 mg daily and Hyzaar 100/12.5 mg daily no further episodes of chest pain.  GERD with gastritis and esophagitis by recent EGD.  Reviewed GI notes and scans.  Now on PPI.  Feels fine.  Hyperlipidemia with goal LDL less than 70 due to CAD.  Started Crestor 20 mg 4 to 6 weeks ago.  Fasting today.  Tolerating well  Sleep apnea: Using CPAP.  Works well.  Family history of colon cancer: Colonoscopy was done.  Next due in 5 years.  Erectile dysfunction: Has been  using Viagra with fairly good results but has used Cialis in the past and prefers it.  Would like refill.  No adverse effects.   Patient Active Problem List   Diagnosis Date Noted  . Coronary artery disease involving native coronary artery of native heart without angina pectoris 06/20/2020  . Family history of premature CAD 06/20/2020  . Family history of colon cancer 12/16/2018  . Adenomatous polyp 12/16/2018  . Mixed hyperlipidemia 12/16/2018  . White coat syndrome with hypertension 12/16/2018  . OSA on CPAP 12/16/2018  . Obesity (BMI 30-39.9) 12/16/2018  . GERD (gastroesophageal reflux disease) 12/16/2018  . ED (erectile dysfunction) 12/16/2018   Health Maintenance  Topic Date Due  . Hepatitis C Screening  Never done  . COVID-19 Vaccine (4 - Booster for Pfizer series) 10/04/2020  . COLONOSCOPY (Pts 45-42yrs Insurance coverage will need to be confirmed)  03/18/2024  . TETANUS/TDAP  10/20/2026  . INFLUENZA VACCINE  Completed  . HIV Screening  Completed  . HPV VACCINES  Aged Out   Immunization History  Administered Date(s) Administered  . Hepatitis A, Adult 01/20/2004, 07/07/2015  . Hepatitis B, adult 08/09/2005, 09/11/2005, 03/05/2006  . Influenza Split 03/05/2006, 04/03/2007, 01/07/2008, 03/25/2009, 01/27/2010, 01/17/2011, 03/20/2012  . Influenza,inj,Quad PF,6+ Mos 02/16/2018, 12/16/2018, 12/17/2019  . Influenza,inj,quad, With Preservative 04/20/2013, 01/15/2014, 01/29/2015, 01/13/2016, 02/16/2018  . PFIZER(Purple Top)SARS-COV-2 Vaccination 06/23/2019, 07/21/2019, 04/05/2020  . Tdap 04/03/2007, 10/19/2016   We updated  and reviewed the patient's past history in detail and it is documented below. Allergies: Patient is allergic to lisinopril. Past Medical History  has a past medical history of Atypical nevus (2009), Atypical nevus (2019), Colon polyp (12/16/2018), Essential hypertension (12/16/2018), Family history of colon cancer (12/16/2018), Family history of premature CAD  (06/20/2020), GERD (gastroesophageal reflux disease) (12/16/2018), Mixed hyperlipidemia (12/16/2018), OSA on CPAP (12/16/2018), and Sleep apnea. Past Surgical History Patient  has a past surgical history that includes Wisdom tooth extraction. Social History Patient  reports that he has never smoked. He has never used smokeless tobacco. He reports current alcohol use. He reports that he does not use drugs. Family History family history includes Alcohol abuse in his brother, maternal grandfather, and paternal grandfather; Arthritis in his father; Colon cancer in his father; Hearing loss in his father; Heart disease in his father; Hyperlipidemia in his brother and mother; Hypertension in his brother and father. Review of Systems: Constitutional: negative for fever or malaise Ophthalmic: negative for photophobia, double vision or loss of vision Cardiovascular: negative for chest pain, dyspnea on exertion, or new LE swelling Respiratory: negative for SOB or persistent cough Gastrointestinal: negative for abdominal pain, change in bowel habits or melena Genitourinary: negative for dysuria or gross hematuria Musculoskeletal: negative for new gait disturbance or muscular weakness Integumentary: negative for new or persistent rashes Neurological: negative for TIA or stroke symptoms Psychiatric: negative for SI or delusions Allergic/Immunologic: negative for hives  Patient Care Team    Relationship Specialty Notifications Start End  [name], MD PCP - General Family Medicine  12/16/18   Freada Bergeron, MD PCP - Cardiology Cardiology  03/28/20   Thornton Park, MD Consulting Physician Gastroenterology  03/30/19   Janne Napoleon, PA-C (Inactive)  Dermatology  09/24/19   Lavonna Monarch, MD Consulting Physician Dermatology  04/13/20    Objective  Vitals: BP 132/82 Comment: by home reading this am  Pulse 71   Temp 97.9 F (36.6 C) (Temporal)   Ht 5\' 11"  (1.803 m)   Wt 263 lb (119.3  kg)   SpO2 98%   BMI 36.68 kg/m  General:  Well developed, well nourished, no acute distress  Psych:  Alert and orientedx3,normal mood and affect HEENT:  Normocephalic, atraumatic, non-icteric sclera,  supple neck without adenopathy, mass or thyromegaly Cardiovascular:  Normal S1, S2, RRR without gallop, rub or murmur,  +2 distal pulses in bilateral upper and lower extremities. Respiratory:  Good breath sounds bilaterally, CTAB with normal respiratory effort Gastrointestinal: normal bowel sounds, soft, non-tender, no noted masses. No HSM MSK: no deformities, contusions. Joints are without erythema or swelling. Spine and CVA region are nontender Skin:  Warm, no rashes or suspicious lesions noted Neurologic:    Mental status is normal. CN 2-11 are normal. Gross motor and sensory exams are normal.  GU: No inguinal hernias or adenopathy are appreciated bilaterally   Assessment  1. Annual physical exam   2. White coat syndrome with hypertension   3. Mixed hyperlipidemia   4. Obesity (BMI 30-39.9)   5. OSA on CPAP   6. Family history of colon cancer   7. Adenomatous polyp   8. Erectile dysfunction, unspecified erectile dysfunction type   9. Chronic superficial gastritis without bleeding   10. Coronary artery disease involving native coronary artery of native heart without angina pectoris   11. Gastroesophageal reflux disease with esophagitis without hemorrhage   12. Family history of premature CAD      Plan  Male Wellness Visit:  Age appropriate Health Maintenance and Prevention measures were discussed with patient. Included topics are cancer screening recommendations, ways to keep healthy (see AVS) including dietary and exercise recommendations, regular eye and dental care, use of seat belts, and avoidance of moderate alcohol use and tobacco use.  Screens are up-to-date  BMI: discussed patient's BMI and encouraged positive lifestyle modifications to help get to or maintain a target BMI.   We did discuss possibly using weight loss medications at follow-up if unsuccessful with weight loss with diet and exercise.  HM needs and immunizations were addressed and ordered. See below for orders. See HM and immunization section for updates.  Immunizations up-to-date.  Eligible for Shingrix next year  Routine labs and screening tests ordered including cmp, cbc and lipids where appropriate.  Discussed recommendations regarding Vit D and calcium supplementation (see AVS)  Chronic disease f/u and/or acute problem visit: (deemed necessary to be done in addition to the wellness visit):  CAD: Aggressive secondary prevention instituted.  Recommend heart healthy diet.  Weight loss.  Hypertension with whitecoat response: Continue home monitoring.  Change Hyzaar to 100/25 mg daily.  Continue amlodipine 10 mg daily.  Continue home monitoring and if remains elevated, will need third medication added.  Patient to follow-up in 3 months as needed.  If stabilizes, 6 months.  Hyperlipidemia on Crestor 20 mg nightly.  Recheck levels today.  If not at goal we will recheck again in 4 weeks.  Gastritis/GERD now on PPI.  Improving.  Erectile dysfunction: Change to Cialis 10 mg daily as needed.  May increase to 20 mg if needed.  History of adenomatous polyp with family history of colon cancer: Most recent colonoscopy stable.  Recheck 5 years.  Continue CPAP  Follow up: 3 to 6 months for follow-up blood pressure  Commons side effects, risks, benefits, and alternatives for medications and treatment plan prescribed today were discussed, and the patient expressed understanding of the given instructions. Patient is instructed to call or message via MyChart if he/she has any questions or concerns regarding our treatment plan. No barriers to understanding were identified. We discussed Red Flag symptoms and signs in detail. Patient expressed understanding regarding what to do in case of urgent or emergency type  symptoms.   Medication list was reconciled, printed and provided to the patient in AVS. Patient instructions and summary information was reviewed with the patient as documented in the AVS. This note was prepared with assistance of Dragon voice recognition software. Occasional wrong-word or sound-a-like substitutions may have occurred due to the inherent limitations of voice recognition software  This visit occurred during the SARS-CoV-2 public health emergency.  Safety protocols were in place, including screening questions prior to the visit, additional usage of staff PPE, and extensive cleaning of exam room while observing appropriate contact time as indicated for disinfecting solutions.   Orders Placed This Encounter  Procedures  . Hepatitis C antibody  . CBC with Differential/Platelet  . Comprehensive metabolic panel  . Lipid panel   Meds ordered this encounter  Medications  . tadalafil (CIALIS) 10 MG tablet    Sig: Take 1 tablet (10 mg total) by mouth daily as needed for erectile dysfunction.    Dispense:  12 tablet    Refill:  3  . losartan-hydrochlorothiazide (HYZAAR) 100-25 MG tablet    Sig: Take 1 tablet by mouth daily.    Dispense:  90 tablet    Refill:  3

## 2020-06-21 NOTE — Progress Notes (Signed)
Please add on hgbA1c, dx: hyperglycemia Thanks, Dr. Jonni Sanger '

## 2020-06-22 ENCOUNTER — Other Ambulatory Visit (INDEPENDENT_AMBULATORY_CARE_PROVIDER_SITE_OTHER): Payer: BC Managed Care – PPO

## 2020-06-22 DIAGNOSIS — R739 Hyperglycemia, unspecified: Secondary | ICD-10-CM | POA: Diagnosis not present

## 2020-06-22 LAB — HEMOGLOBIN A1C: Hgb A1c MFr Bld: 5.1 % (ref 4.6–6.5)

## 2020-07-15 NOTE — Telephone Encounter (Signed)
Pt was able to get scheduled for his cardiac CT on 05/26/20.  This was completed.    Freada Bergeron, MD  05/30/2020 8:24 AM EST      His CT of his heart arteries show that he has no significant blockages in his arteries, however, he has some plaquing with an elevated calcium score of 250. This means while his chest pain was not due to blockages in his arteries, we need to prevent the plaque that is present from getting worse. He needs to start ASA 81mg  daily and can we change him from pravastatin 10mg  daily to crestor 20mg  daily? His goal LDL is <70 and he needs a stronger statin due to the degree of plaque in his arteries. We also need to make sure he works on a healthy diet and regular exercise to help decrease his risk of future cardiac events.   Pt received results via triage nurse on 2/15.  Pt saw Dr. Johney Frame in clinic on 2/28, to further discuss.  Plan and details discussed at that visit.

## 2020-07-20 ENCOUNTER — Encounter: Payer: Self-pay | Admitting: Family Medicine

## 2020-08-03 DIAGNOSIS — G4733 Obstructive sleep apnea (adult) (pediatric): Secondary | ICD-10-CM | POA: Diagnosis not present

## 2020-08-11 ENCOUNTER — Ambulatory Visit: Payer: Self-pay | Attending: Internal Medicine

## 2020-08-11 DIAGNOSIS — Z20822 Contact with and (suspected) exposure to covid-19: Secondary | ICD-10-CM

## 2020-08-12 LAB — NOVEL CORONAVIRUS, NAA: SARS-CoV-2, NAA: NOT DETECTED

## 2020-08-12 LAB — SARS-COV-2, NAA 2 DAY TAT

## 2020-08-19 ENCOUNTER — Other Ambulatory Visit: Payer: Self-pay | Admitting: Family Medicine

## 2020-09-07 ENCOUNTER — Encounter: Payer: Self-pay | Admitting: Family Medicine

## 2020-09-07 ENCOUNTER — Other Ambulatory Visit: Payer: Self-pay

## 2020-09-07 ENCOUNTER — Telehealth (INDEPENDENT_AMBULATORY_CARE_PROVIDER_SITE_OTHER): Payer: Commercial Managed Care - PPO | Admitting: Family Medicine

## 2020-09-07 VITALS — Temp 97.2°F | Ht 71.0 in | Wt 258.0 lb

## 2020-09-07 DIAGNOSIS — U071 COVID-19: Secondary | ICD-10-CM

## 2020-09-07 MED ORDER — FLUTICASONE PROPIONATE 50 MCG/ACT NA SUSP: 1.0000 | Freq: Every day | NASAL | 6 refills | Status: DC

## 2020-09-07 MED ORDER — NIRMATRELVIR/RITONAVIR (PAXLOVID)TABLET: ORAL_TABLET | ORAL | 0 refills | Status: DC

## 2020-09-07 NOTE — Progress Notes (Signed)
Virtual Visit via Video Note  Subjective  CC:  Chief Complaint  Patient presents with  . Covid Positive    First day of symptoms this morning, tested positive today.  . Nasal Congestion  . Cough    Coughed up little mucus, no color.     I connected with [name] on 09/07/20 at  1:30 PM EDT by a video enabled telemedicine application and verified that I am speaking with the correct person using two identifiers. Location patient: Home Location provider: [facility] [place], Office Persons participating in the virtual visit: Manuel Wagner, [name], MD [place]  I discussed the limitations of evaluation and management by telemedicine and the availability of in person appointments. The patient expressed understanding and agreed to proceed. HPI: Manuel Wagner is a 50 y.o. male who was contacted today to address the problems listed above in the chief complaint. . 50 year old with obesity, hypertension and asymptomatic coronary artery disease presents with less than 24-hour history of mild sinus and cold symptoms.  He was recently out of town for conference.  He has been testing for COVID daily.  This morning multiple home test came back positive.  He denies change in taste, severe headache, fatigue, shortness of breath but has mild coughing.  Overall feels fairly well.  Eating and drinking normally.  He is fully vaccinated against COVID.  Because of his risk factors, we are discussing treatment options.   Assessment  1. COVID-19 virus infection      Plan   COVID infection: First day of symptoms.  Mild currently.  High risk for severe disease given comorbidities hypertension, coronary disease and obesity.  Discussed treatment options including Paxlovid.  Typically is well tolerated.  Can help prevent progression of disease.  Treat supportively with Flonase, antihistamine, rest, hydration and good nutrition.  Ordered paxlovid for treatment as  well. I discussed the assessment and treatment plan with the patient. The patient was provided an opportunity to ask questions and all were answered. The patient agreed with the plan and demonstrated an understanding of the instructions.   The patient was advised to call back or seek an in-person evaluation if the symptoms worsen or if the condition fails to improve as anticipated. Follow up: As needed 09/22/2020  Meds ordered this encounter  Medications  . fluticasone (FLONASE) 50 MCG/ACT nasal spray    Sig: Place 1 spray into both nostrils daily.    Dispense:  16 g    Refill:  6  . nirmatrelvir/ritonavir EUA (PAXLOVID) TABS    Sig: Take nirmatrelvir (150 mg) two tablets twice daily for 5 days and ritonavir (100 mg) one tablet twice daily for 5 days.    Dispense:  30 tablet    Refill:  0    Patient GFR is 105 06/2020.      I reviewed the patients updated PMH, FH, and SocHx.    Patient Active Problem List   Diagnosis Date Noted  . Coronary artery disease involving native coronary artery of native heart without angina pectoris 06/20/2020    Priority: High  . Family history of premature CAD 06/20/2020    Priority: High  . Family history of colon cancer 12/16/2018    Priority: High  . Adenomatous polyp 12/16/2018    Priority: High  . Mixed hyperlipidemia 12/16/2018    Priority: High  . White coat syndrome with hypertension 12/16/2018    Priority: High  . OSA on CPAP  12/16/2018    Priority: High  . Obesity (BMI 30-39.9) 12/16/2018    Priority: High  . GERD (gastroesophageal reflux disease) 12/16/2018    Priority: Medium  . ED (erectile dysfunction) 12/16/2018    Priority: Low   Current Meds  Medication Sig  . amLODipine (NORVASC) 10 MG tablet Take 1 tablet (10 mg total) by mouth daily.  Marland Kitchen aspirin 81 MG chewable tablet Chew 81 mg by mouth daily.  Marland Kitchen esomeprazole (NEXIUM) 20 MG capsule Take 1 capsule (20 mg total) by mouth daily.  . fluticasone (FLONASE) 50 MCG/ACT nasal  spray Place 1 spray into both nostrils daily.  Marland Kitchen losartan-hydrochlorothiazide (HYZAAR) 100-25 MG tablet Take 1 tablet by mouth daily.  . nirmatrelvir/ritonavir EUA (PAXLOVID) TABS Take nirmatrelvir (150 mg) two tablets twice daily for 5 days and ritonavir (100 mg) one tablet twice daily for 5 days.  . tadalafil (CIALIS) 10 MG tablet Take 1 tablet (10 mg total) by mouth daily as needed for erectile dysfunction.  Marland Kitchen tretinoin (RETIN-A) 0.05 % cream Apply 1 application topically at bedtime.    Allergies: Patient is allergic to lisinopril. Family History: Patient family history includes Alcohol abuse in his brother, maternal grandfather, and paternal grandfather; Arthritis in his father; Colon cancer in his father; Hearing loss in his father; Heart disease in his father; Hyperlipidemia in his brother and mother; Hypertension in his brother and father. Social History:  Patient  reports that he has never smoked. He has never used smokeless tobacco. He reports current alcohol use. He reports that he does not use drugs.  Review of Systems: Constitutional: Negative for fever malaise or anorexia Cardiovascular: negative for chest pain Respiratory: negative for SOB or persistent cough Gastrointestinal: negative for abdominal pain  OBJECTIVE Vitals: Temp (!) 97.2 F (36.2 C) (Oral)   Ht 5\' 11"  (1.803 m)   Wt 258 lb (117 kg)   BMI 35.98 kg/m  General: no acute distress , A&Ox3 HEENT, mild congestion present.  No respiratory distress.  Appears well  [name], MD

## 2020-09-12 ENCOUNTER — Other Ambulatory Visit: Payer: Self-pay | Admitting: Family Medicine

## 2020-09-22 ENCOUNTER — Ambulatory Visit: Payer: BC Managed Care – PPO | Admitting: Family Medicine

## 2020-10-03 ENCOUNTER — Ambulatory Visit: Payer: Commercial Managed Care - PPO | Admitting: Family Medicine

## 2020-10-03 ENCOUNTER — Encounter: Payer: Self-pay | Admitting: Family Medicine

## 2020-10-03 ENCOUNTER — Other Ambulatory Visit: Payer: Self-pay

## 2020-10-03 VITALS — BP 127/76 | HR 78 | Temp 98.1°F | Resp 15 | Ht 71.0 in | Wt 267.0 lb

## 2020-10-03 DIAGNOSIS — Z8249 Family history of ischemic heart disease and other diseases of the circulatory system: Secondary | ICD-10-CM | POA: Diagnosis not present

## 2020-10-03 DIAGNOSIS — G4733 Obstructive sleep apnea (adult) (pediatric): Secondary | ICD-10-CM

## 2020-10-03 DIAGNOSIS — I2510 Atherosclerotic heart disease of native coronary artery without angina pectoris: Secondary | ICD-10-CM | POA: Diagnosis not present

## 2020-10-03 DIAGNOSIS — Z8616 Personal history of COVID-19: Secondary | ICD-10-CM

## 2020-10-03 DIAGNOSIS — Z9989 Dependence on other enabling machines and devices: Secondary | ICD-10-CM

## 2020-10-03 DIAGNOSIS — I10 Essential (primary) hypertension: Secondary | ICD-10-CM

## 2020-10-03 DIAGNOSIS — K219 Gastro-esophageal reflux disease without esophagitis: Secondary | ICD-10-CM

## 2020-10-03 DIAGNOSIS — E669 Obesity, unspecified: Secondary | ICD-10-CM

## 2020-10-03 NOTE — Patient Instructions (Signed)
Please return in March 2023 for your annual complete physical; please come fasting.   If you have any questions or concerns, please don't hesitate to send me a message via MyChart or call the office at [phone]. Thank you for visiting with [place] today! It's our pleasure caring for you.

## 2020-10-03 NOTE — Progress Notes (Signed)
Subjective  CC:  Chief Complaint  Patient presents with   Hypertension    Does check BP at home, 120-130/70's    Hyperlipidemia   Sleep Apnea   Gastroesophageal Reflux    HPI: Manuel Wagner is a 50 y.o. male who presents to the office today to address the problems listed above in the chief complaint. Hypertension f/u: Control is good . Pt reports he is doing well. taking medications as instructed, no medication side effects noted, no TIAs, no chest pain on exertion, no dyspnea on exertion, no swelling of ankles.  Home readings over March April and May are reviewed.  Average 120s over 70s.  In March we change his medication by increasing Hyzaar dose to 100/25 daily.  He continues on amlodipine 10 mg daily.  He denies adverse effects from his BP medications. Compliance with medication is good.  No further chest pain. GERD on PPI.  Doing well.  Had EGD.  No further symptoms. Obesity: Weight is up a few pounds again. Hyperlipidemia now well controlled on high-intensity statin. Had COVID last month.  Treated with antivirals.  Did very well.  Has mild persistent cough but no other symptoms.  Assessment  1. White coat syndrome with hypertension   2. Coronary artery disease involving native coronary artery of native heart without angina pectoris   3. Family history of premature CAD   4. OSA on CPAP   5. Obesity (BMI 30-39.9)   6. Gastroesophageal reflux disease, unspecified whether esophagitis present   7. Personal history of COVID-19      Plan   Hypertension f/u: BP control is well controlled.  Has whitecoat syndrome.  Continue home monitoring Hyperlipidemia f/u: This medical condition is well controlled. There are no signs of complications, medication side effects, or red flags. Patient is instructed to continue the current treatment plan without change in therapies or medications. GERD: Asymptomatic now.  Has completed months of therapy.  He will check with GI to see if he can try to  taper off of this medicine.  PPI. Family history of CAD with CAD months.  Asymptomatic.  On aspirin. History of COVID: Resolving.  Discussed booster vaccination in the next 1 to 3 months. Education regarding management of these chronic disease states was given. Management strategies discussed on successive visits include dietary and exercise recommendations, goals of achieving and maintaining IBW, and lifestyle modifications aiming for adequate sleep and minimizing stressors.   Follow up: March 2023 for complete physical  No orders of the defined types were placed in this encounter.  No orders of the defined types were placed in this encounter.     BP Readings from Last 3 Encounters:  10/03/20 127/76  06/20/20 132/82  06/13/20 (!) 142/62   Wt Readings from Last 3 Encounters:  10/03/20 267 lb (121.1 kg)  09/07/20 258 lb (117 kg)  06/20/20 263 lb (119.3 kg)    Lab Results  Component Value Date   CHOL 102 06/20/2020   CHOL 145 06/16/2019   Lab Results  Component Value Date   HDL 35.90 (L) 06/20/2020   HDL 32.90 (L) 06/16/2019   Lab Results  Component Value Date   LDLCALC 47 06/20/2020   LDLCALC 89 06/16/2019   Lab Results  Component Value Date   TRIG 96.0 06/20/2020   TRIG 116.0 06/16/2019   Lab Results  Component Value Date   CHOLHDL 3 06/20/2020   CHOLHDL 4 06/16/2019   No results found for: LDLDIRECT Lab Results  Component  Value Date   CREATININE 0.76 06/20/2020   BUN 15 06/20/2020   NA 139 06/20/2020   K 3.8 06/20/2020   CL 103 06/20/2020   CO2 24 06/20/2020    The ASCVD Risk score Mikey Bussing DC Jr., et al., 2013) failed to calculate for the following reasons:   The valid total cholesterol range is 130 to 320 mg/dL  I reviewed the patients updated PMH, FH, and SocHx.    Patient Active Problem List   Diagnosis Date Noted   Coronary artery disease involving native coronary artery of native heart without angina pectoris 06/20/2020    Priority: High    Family history of premature CAD 06/20/2020    Priority: High   Family history of colon cancer 12/16/2018    Priority: High   Adenomatous polyp 12/16/2018    Priority: High   Mixed hyperlipidemia 12/16/2018    Priority: High   White coat syndrome with hypertension 12/16/2018    Priority: High   OSA on CPAP 12/16/2018    Priority: High   Obesity (BMI 30-39.9) 12/16/2018    Priority: High   GERD (gastroesophageal reflux disease) 12/16/2018    Priority: Medium   ED (erectile dysfunction) 12/16/2018    Priority: Low    Allergies: Lisinopril  Social History: Patient  reports that he has never smoked. He has never used smokeless tobacco. He reports current alcohol use. He reports that he does not use drugs.  Current Meds  Medication Sig   amLODipine (NORVASC) 10 MG tablet Take 1 tablet (10 mg total) by mouth daily.   aspirin 81 MG chewable tablet Chew 81 mg by mouth daily.   esomeprazole (NEXIUM) 20 MG capsule Take 1 capsule (20 mg total) by mouth daily.   fluticasone (FLONASE) 50 MCG/ACT nasal spray Place 1 spray into both nostrils daily.   hydroquinone 4 % cream Apply topically 2 (two) times daily.   losartan-hydrochlorothiazide (HYZAAR) 100-25 MG tablet Take 1 tablet by mouth daily.   nirmatrelvir/ritonavir EUA (PAXLOVID) TABS Take nirmatrelvir (150 mg) two tablets twice daily for 5 days and ritonavir (100 mg) one tablet twice daily for 5 days.   tadalafil (CIALIS) 10 MG tablet Take 1 tablet (10 mg total) by mouth daily as needed for erectile dysfunction.   tretinoin (RETIN-A) 0.05 % cream Apply 1 application topically at bedtime.   [DISCONTINUED] hydrochlorothiazide (HYDRODIURIL) 12.5 MG tablet TAKE 1 TABLET BY MOUTH EVERY DAY   [DISCONTINUED] losartan (COZAAR) 100 MG tablet TAKE 1 TABLET BY MOUTH EVERY DAY    Review of Systems: Cardiovascular: negative for chest pain, palpitations, leg swelling, orthopnea Respiratory: negative for SOB, wheezing or persistent  cough Gastrointestinal: negative for abdominal pain Genitourinary: negative for dysuria or gross hematuria  Objective  Vitals: BP 127/76 Comment: By consistent home readings, average  Pulse 78   Temp 98.1 F (36.7 C) (Temporal)   Resp 15   Ht 5\' 11"  (1.803 m)   Wt 267 lb (121.1 kg)   SpO2 97%   BMI 37.24 kg/m  General: no acute distress  Psych:  Alert and oriented, normal mood and affect HEENT:  Normocephalic, atraumatic, supple neck  Cardiovascular:  RRR without murmur. no edema Respiratory:  Good breath sounds bilaterally, CTAB with normal respiratory effort  Neurologic:   Mental status is normal Commons side effects, risks, benefits, and alternatives for medications and treatment plan prescribed today were discussed, and the patient expressed understanding of the given instructions. Patient is instructed to call or message via MyChart if he/she  has any questions or concerns regarding our treatment plan. No barriers to understanding were identified. We discussed Red Flag symptoms and signs in detail. Patient expressed understanding regarding what to do in case of urgent or emergency type symptoms.  Medication list was reconciled, printed and provided to the patient in AVS. Patient instructions and summary information was reviewed with the patient as documented in the AVS. This note was prepared with assistance of Dragon voice recognition software. Occasional wrong-word or sound-a-like substitutions may have occurred due to the inherent limitations of voice recognition software  This visit occurred during the SARS-CoV-2 public health emergency.  Safety protocols were in place, including screening questions prior to the visit, additional usage of staff PPE, and extensive cleaning of exam room while observing appropriate contact time as indicated for disinfecting solutions.

## 2020-10-12 ENCOUNTER — Encounter: Payer: Self-pay | Admitting: Dermatology

## 2020-10-12 ENCOUNTER — Other Ambulatory Visit: Payer: Self-pay

## 2020-10-12 ENCOUNTER — Ambulatory Visit: Payer: Commercial Managed Care - PPO | Admitting: Dermatology

## 2020-10-12 DIAGNOSIS — Z1283 Encounter for screening for malignant neoplasm of skin: Secondary | ICD-10-CM | POA: Diagnosis not present

## 2020-10-12 DIAGNOSIS — L111 Transient acantholytic dermatosis [Grover]: Secondary | ICD-10-CM

## 2020-10-12 DIAGNOSIS — L918 Other hypertrophic disorders of the skin: Secondary | ICD-10-CM | POA: Diagnosis not present

## 2020-10-12 DIAGNOSIS — Z86018 Personal history of other benign neoplasm: Secondary | ICD-10-CM | POA: Diagnosis not present

## 2020-10-12 DIAGNOSIS — L821 Other seborrheic keratosis: Secondary | ICD-10-CM

## 2020-10-12 DIAGNOSIS — Z87898 Personal history of other specified conditions: Secondary | ICD-10-CM

## 2020-10-24 NOTE — Telephone Encounter (Signed)
This is a patient of Dr. Tarri Glenn, I would advise the patient to remain on current dose of omeprazole until she returns and can weigh in regarding long-term use. JMP

## 2020-10-29 ENCOUNTER — Encounter: Payer: Self-pay | Admitting: Dermatology

## 2020-10-29 NOTE — Progress Notes (Signed)
   Follow-Up Visit   Subjective  Manuel Wagner is a 50 y.o. male who presents for the following: Follow-up (No new concerns ).  General skin examination Location:  Duration:  Quality:  Associated Signs/Symptoms: Modifying Factors:  Severity:  Timing: Context:   Objective  Well appearing patient in no apparent distress; mood and affect are within normal limits. Left Flank 6 month check due to atypical history Derm in [place] =EXC SCAR CLEAR  Left Upper Back 6 month check due to atypical history Derm in [place] =EXC SCAR CLEAR  Mid Back Many keratoses too many to count all seem stable and patient stated they don't itch  Right Upper Eyelid Tag 17mm pedunculated papule stable on the lid  Mid Back 2 dozen 2 mm inflamed pink papules lower back compatible with Grovers disease    A full examination was performed including scalp, head, eyes, ears, nose, lips, neck, chest, axillae, abdomen, back, buttocks, bilateral upper extremities, bilateral lower extremities, hands, feet, fingers, toes, fingernails, and toenails. All findings within normal limits unless otherwise noted below.   Assessment & Plan    Screening exam for skin cancer Left Flank  History of atypical nevus Left Upper Back  No repigmentation, check prn change  Seborrheic keratosis Mid Back  Leave if stable  Skin tag Right Upper Eyelid  May choose to remove in future  Grover's disease Mid Back  Currently no symptoms so no intervention initiated.  We will contact me if the rash spreads or begins to itch.      I, Lavonna Monarch, MD, have reviewed all documentation for this visit.  The documentation on 10/29/20 for the exam, diagnosis, procedures, and orders are all accurate and complete.

## 2020-12-07 ENCOUNTER — Encounter: Payer: Self-pay | Admitting: Family Medicine

## 2020-12-08 ENCOUNTER — Other Ambulatory Visit: Payer: Self-pay

## 2020-12-08 MED ORDER — TADALAFIL 20 MG PO TABS: 10.0000 mg | ORAL_TABLET | ORAL | 0 refills | Status: DC | PRN

## 2021-01-01 ENCOUNTER — Encounter: Payer: Self-pay | Admitting: Family Medicine

## 2021-01-24 ENCOUNTER — Encounter: Payer: Self-pay | Admitting: Family Medicine

## 2021-01-25 ENCOUNTER — Other Ambulatory Visit: Payer: Self-pay

## 2021-01-25 MED ORDER — AMLODIPINE BESYLATE 10 MG PO TABS: 10.0000 mg | ORAL_TABLET | Freq: Every day | ORAL | 3 refills | Status: DC

## 2021-02-01 ENCOUNTER — Encounter: Payer: Self-pay | Admitting: Family Medicine

## 2021-02-02 ENCOUNTER — Telehealth: Payer: Self-pay

## 2021-02-02 NOTE — Telephone Encounter (Signed)
fyi

## 2021-02-02 NOTE — Telephone Encounter (Signed)
Patient is scheduled for tomorrow.   Nurse Assessment Nurse: Markus Daft, RN, Sherre Poot Date/Time Eilene Ghazi Time): 02/02/2021 9:50:04 AM Confirm and document reason for call. If symptomatic, describe symptoms. ---Caller states he has nasal congestion with sinus pain/ pressure. Suspects he has sinus infection. S/S started Tuesday. Does the patient have any new or worsening symptoms? ---Yes Will a triage be completed? ---Yes Related visit to physician within the last 2 weeks? ---No Does the PT have any chronic conditions? (i.e. diabetes, asthma, this includes High risk factors for pregnancy, etc.) ---No Is this a behavioral health or substance abuse call? ---No Guidelines Guideline Title Affirmed Question Affirmed Notes Nurse Date/Time Eilene Ghazi Time) Sinus Pain or Congestion [1] Sinus congestion as part of a cold AND [2] present < 10 days Vivianne Master, Sherre Poot 02/02/2021 9:51:41 AM Disp. Time Eilene Ghazi Time) Disposition Final User 02/02/2021 9:54:05 AM [place], RN, [place] PLEASE NOTE: All timestamps contained within this report are represented as [place] Standard Time. CONFIDENTIALTY NOTICE: This fax transmission is intended only for the addressee. It contains information that is legally privileged, confidential or otherwise protected from use or disclosure. If you are not the intended recipient, you are strictly prohibited from reviewing, disclosing, copying using or disseminating any of this information or taking any action in reliance on or regarding this information. If you have received this fax in error, please notify us immediately by telephone so that we can arrange for its return to [place]. Phone: [phone], Toll-Free: [phone], Fax: [phone] Page: 2 of 2 Call Id: [id number] [place] Disagree/Comply Comply Caller Understands Yes PreDisposition InappropriateToAsk Care Advice Given Per Guideline HOME CARE: * You should be able to treat this at home. REASSURANCE AND  EDUCATION - COLDS AND SINUS CONGESTION: * Sinus congestion is a normal part of a cold. * Usually home treatment with nasal washes can prevent an actual bacterial sinus infection. NASAL WASHES FOR A STUFFY NOSE: * Introduction: Saline (salt water) nasal irrigation (nasal wash) is an effective and simple home remedy for treating stuffy nose and sinus congestion. The nose can be irrigated by pouring, spraying, or squirting salt water into the nose and then letting it run back out. * How it Helps: The salt water rinses out excess mucus and washes out any irritants (dust, allergens) that might be present. It also moistens the nasal cavity. NASAL DECONGESTANTS FOR A VERY STUFFY NOSE: * MOST PEOPLE DO NOT NEED TO USE THESE MEDICINES. * If your nose feels blocked, you should try using nasal washes first. * If you have a very stuffy nose, nasal decongestant medicines can shrink the swollen nasal mucosa and allow for easier breathing. If you have a very runny nose, these medicines can reduce the amount of drainage. They may be taken as pills by mouth or as a nasal spray. * Pseudoephedrine (Sudafed): Available over-the-counter in pill form. Typical adult dosage is two 30 mg tablets every 6 hours. * Read the package instructions on all medicines that you take. * Do not use these medicines for more than 3 days. Reason: Rebound nasal congestion when you stop taking them. EXPECTED COURSE: * Sinus congestion from viral upper respiratory infections (colds) usually lasts 5 to 10 days. CALL BACK IF: * Severe pain persists over 2 hours after pain medicine * Sinus pain persists over 1 day after using nasal washes * Sinus congestion (fullness) persists over 10 days * You become worse CARE ADVICE given per Sinus Pain or Congestion (Adult) guideline

## 2021-02-03 ENCOUNTER — Other Ambulatory Visit: Payer: Self-pay

## 2021-02-03 ENCOUNTER — Encounter: Payer: Self-pay | Admitting: Family Medicine

## 2021-02-03 ENCOUNTER — Ambulatory Visit: Payer: Managed Care, Other (non HMO) | Admitting: Family Medicine

## 2021-02-03 VITALS — BP 140/84 | HR 78 | Temp 98.8°F | Ht 71.0 in | Wt 266.6 lb

## 2021-02-03 DIAGNOSIS — J0100 Acute maxillary sinusitis, unspecified: Secondary | ICD-10-CM

## 2021-02-03 MED ORDER — AMOXICILLIN-POT CLAVULANATE 875-125 MG PO TABS: 1.0000 | ORAL_TABLET | Freq: Two times a day (BID) | ORAL | 0 refills | Status: AC

## 2021-02-03 NOTE — Patient Instructions (Signed)
Please follow up as scheduled for your next visit with me: 07/06/2021   If you have any questions or concerns, please don't hesitate to send me a message via MyChart or call the office at [phone]. Thank you for visiting with [place] today! It's our pleasure caring for you.   Sinusitis, Adult Sinusitis is soreness and swelling (inflammation) of your sinuses. Sinuses are hollow spaces in the bones around your face. They are located: Around your eyes. In the middle of your forehead. Behind your nose. In your cheekbones. Your sinuses and nasal passages are lined with a fluid called mucus. Mucus drains out of your sinuses. Swelling can trap mucus in your sinuses. This lets germs (bacteria, virus, or fungus) grow, which leads to infection. Most of the time, this condition is caused by a virus. What are the causes? This condition is caused by: Allergies. Asthma. Germs. Things that block your nose or sinuses. Growths in the nose (nasal polyps). Chemicals or irritants in the air. Fungus (rare). What increases the risk? You are more likely to develop this condition if: You have a weak body defense system (immune system). You do a lot of swimming or diving. You use nasal sprays too much. You smoke. What are the signs or symptoms? The main symptoms of this condition are pain and a feeling of pressure around the sinuses. Other symptoms include: Stuffy nose (congestion). Runny nose (drainage). Swelling and warmth in the sinuses. Headache. Toothache. A cough that may get worse at night. Mucus that collects in the throat or the back of the nose (postnasal drip). Being unable to smell and taste. Being very tired (fatigue). A fever. Sore throat. Bad breath. How is this diagnosed? This condition is diagnosed based on: Your symptoms. Your medical history. A physical exam. Tests to find out if your condition is short-term (acute) or long-term (chronic). Your doctor may: Check your nose for  growths (polyps). Check your sinuses using a tool that has a light (endoscope). Check for allergies or germs. Do imaging tests, such as an MRI or CT scan. How is this treated? Treatment for this condition depends on the cause and whether it is short-term or long-term. If caused by a virus, your symptoms should go away on their own within 10 days. You may be given medicines to relieve symptoms. They include: Medicines that shrink swollen tissue in the nose. Medicines that treat allergies (antihistamines). A spray that treats swelling of the nostrils.  Rinses that help get rid of thick mucus in your nose (nasal saline washes). If caused by bacteria, your doctor may wait to see if you will get better without treatment. You may be given antibiotic medicine if you have: A very bad infection. A weak body defense system. If caused by growths in the nose, you may need to have surgery. Follow these instructions at home: Medicines Take, use, or apply over-the-counter and prescription medicines only as told by your doctor. These may include nasal sprays. If you were prescribed an antibiotic medicine, take it as told by your doctor. Do not stop taking the antibiotic even if you start to feel better. Hydrate and humidify  Drink enough water to keep your pee (urine) pale yellow. Use a cool mist humidifier to keep the humidity level in your home above 50%. Breathe in steam for 10-15 minutes, 3-4 times a day, or as told by your doctor. You can do this in the bathroom while a hot shower is running. Try not to spend time in cool  or dry air. Rest Rest as much as you can. Sleep with your head raised (elevated). Make sure you get enough sleep each night. General instructions  Put a warm, moist washcloth on your face 3-4 times a day, or as often as told by your doctor. This will help with discomfort. Wash your hands often with soap and water. If there is no soap and water, use hand sanitizer. Do not  smoke. Avoid being around people who are smoking (secondhand smoke). Keep all follow-up visits as told by your doctor. This is important. Contact a doctor if: You have a fever. Your symptoms get worse. Your symptoms do not get better within 10 days. Get help right away if: You have a very bad headache. You cannot stop throwing up (vomiting). You have very bad pain or swelling around your face or eyes. You have trouble seeing. You feel confused. Your neck is stiff. You have trouble breathing. Summary Sinusitis is swelling of your sinuses. Sinuses are hollow spaces in the bones around your face. This condition is caused by tissues in your nose that become inflamed or swollen. This traps germs. These can lead to infection. If you were prescribed an antibiotic medicine, take it as told by your doctor. Do not stop taking it even if you start to feel better. Keep all follow-up visits as told by your doctor. This is important. This information is not intended to replace advice given to you by your health care provider. Make sure you discuss any questions you have with your health care provider. Document Revised: 09/02/2017 Document Reviewed: 09/02/2017 Elsevier Patient Education  2022 [employer].

## 2021-02-03 NOTE — Progress Notes (Signed)
Subjective   CC:  Chief Complaint  Patient presents with   Sinus Problem    Started on 01/31/2021. Pressure, congestion, drainage    HPI: Manuel Wagner is a 50 y.o. male who presents to the office today to address the problems listed above in the chief complaint. Patient reports sinus congestion and pressure with thick drainage, mild nonproductive cough, ear pressure without pain, and mild malaise.  Symptoms have been present for several days.Hedenies high fevers, GI symptoms, shortness of breath. Hehas had sinus infections in the past and this feels similar.  Patient is a non-smoker.  No history of asthma or COPD. He has had several negative home tests. He is supposed to travel to [facility] tomorrow but doesn't feel well enough.   I reviewed the patients updated PMH, FH, and SocHx.    Patient Active Problem List   Diagnosis Date Noted   Coronary artery disease involving native coronary artery of native heart without angina pectoris 06/20/2020    Priority: 1.   Family history of premature CAD 06/20/2020    Priority: 1.   Family history of colon cancer 12/16/2018    Priority: 1.   Adenomatous polyp 12/16/2018    Priority: 1.   Mixed hyperlipidemia 12/16/2018    Priority: 1.   White coat syndrome with hypertension 12/16/2018    Priority: 1.   OSA on CPAP 12/16/2018    Priority: 1.   Obesity (BMI 30-39.9) 12/16/2018    Priority: 1.   GERD (gastroesophageal reflux disease) 12/16/2018    Priority: 2.   ED (erectile dysfunction) 12/16/2018    Priority: 3.   Current Meds  Medication Sig   amLODipine (NORVASC) 10 MG tablet Take 1 tablet (10 mg total) by mouth daily.   amoxicillin-clavulanate (AUGMENTIN) 875-125 MG tablet Take 1 tablet by mouth 2 (two) times daily for 10 days.   aspirin 81 MG chewable tablet Chew 81 mg by mouth daily.   esomeprazole (NEXIUM) 20 MG capsule Take 1 capsule (20 mg total) by mouth daily.   hydroquinone 4 % cream Apply topically 2 (two) times daily.    losartan-hydrochlorothiazide (HYZAAR) 100-25 MG tablet Take 1 tablet by mouth daily.   nirmatrelvir/ritonavir EUA (PAXLOVID) TABS Take nirmatrelvir (150 mg) two tablets twice daily for 5 days and ritonavir (100 mg) one tablet twice daily for 5 days.   tadalafil (CIALIS) 20 MG tablet Take 0.5-1 tablets (10-20 mg total) by mouth every other day as needed for erectile dysfunction.   tretinoin (RETIN-A) 0.05 % cream Apply 1 application topically at bedtime.    Review of Systems: Cardiovascular: negative for chest pain Respiratory: negative for SOB or persistent cough Gastrointestinal: negative for abdominal pain Genitourinary: negative for dysuria or gross hematuria  Objective  Vitals: BP 140/84   Pulse 78   Temp 98.8 F (37.1 C) (Temporal)   Ht 5\' 11"  (1.803 m)   Wt 266 lb 9.6 oz (120.9 kg)   SpO2 92%   BMI 37.18 kg/m  General: no acute distress  Psych:  Alert and oriented, normal mood and affect HEENT:  Normocephalic, atraumatic, TMs with serous effusions or retraction w/o erythema, nasal mucosa is red with purulent drainage, tender maxillary sinus present, OP mild erythematous w/o eudate, supple neck without LAD Cardiovascular:  RRR without murmur or gallop. no peripheral edema Respiratory:  Good breath sounds bilaterally, CTAB with normal respiratory effort   Assessment  1. Acute non-recurrent maxillary sinusitis      Plan   Sinusitis: History and exam  is most consistent with bacterial sinus infection.  Etiology and prognosis discussed with patient.  Recommend antibiotics as ordered below.  Patient to complete course of antibiotics, use supportive medications like mucolytics and decongestants as needed.  May use Tylenol or Advil if needed.  Symptoms should improve over the next 2 weeks.  Patient will return or call if symptoms persist or worsen.  Follow up: No follow-ups on file.   Commons side effects, risks, benefits, and alternatives for medications and treatment plan  prescribed today were discussed, and the patient expressed understanding of the given instructions. Patient is instructed to call or message via MyChart if he/she has any questions or concerns regarding our treatment plan. No barriers to understanding were identified. We discussed Red Flag symptoms and signs in detail. Patient expressed understanding regarding what to do in case of urgent or emergency type symptoms.  Medication list was reconciled, printed and provided to the patient in AVS. Patient instructions and summary information was reviewed with the patient as documented in the AVS. This note was prepared with assistance of Dragon voice recognition software. Occasional wrong-word or sound-a-like substitutions may have occurred due to the inherent limitations of voice recognition software  No orders of the defined types were placed in this encounter.  Meds ordered this encounter  Medications   amoxicillin-clavulanate (AUGMENTIN) 875-125 MG tablet    Sig: Take 1 tablet by mouth 2 (two) times daily for 10 days.    Dispense:  20 tablet    Refill:  0

## 2021-02-08 ENCOUNTER — Encounter: Payer: Self-pay | Admitting: Family Medicine

## 2021-02-22 ENCOUNTER — Other Ambulatory Visit: Payer: Self-pay | Admitting: Family Medicine

## 2021-03-07 ENCOUNTER — Telehealth: Payer: Self-pay | Admitting: *Deleted

## 2021-03-07 ENCOUNTER — Encounter: Payer: Self-pay | Admitting: Family Medicine

## 2021-03-07 NOTE — Telephone Encounter (Signed)
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 Can pick up at front desk if approved. Please advise

## 2021-03-07 NOTE — Telephone Encounter (Signed)
Can write note documenting patient is doing massage therapy for achilles tendonitis.   Thanks. See my note to him for clarification.

## 2021-03-08 ENCOUNTER — Encounter: Payer: Self-pay | Admitting: Podiatry

## 2021-03-08 NOTE — Telephone Encounter (Signed)
Please write him a note that massage therapy may be of benefit for achilles tendonitis

## 2021-03-23 ENCOUNTER — Other Ambulatory Visit: Payer: Self-pay | Admitting: Cardiology

## 2021-03-23 DIAGNOSIS — R079 Chest pain, unspecified: Secondary | ICD-10-CM

## 2021-04-06 ENCOUNTER — Encounter: Payer: Self-pay | Admitting: Family Medicine

## 2021-04-06 ENCOUNTER — Other Ambulatory Visit: Payer: Self-pay

## 2021-04-06 ENCOUNTER — Ambulatory Visit: Payer: Managed Care, Other (non HMO) | Admitting: Family Medicine

## 2021-04-06 VITALS — BP 140/76 | HR 82 | Temp 98.3°F | Ht 71.0 in | Wt 256.1 lb

## 2021-04-06 DIAGNOSIS — H6503 Acute serous otitis media, bilateral: Secondary | ICD-10-CM

## 2021-04-06 MED ORDER — FLUTICASONE PROPIONATE 50 MCG/ACT NA SUSP: 2.0000 | Freq: Two times a day (BID) | NASAL | 1 refills | Status: DC

## 2021-04-06 NOTE — Patient Instructions (Signed)
Can use flonase twice daily in nose for ears.   If increased pain, let us know

## 2021-04-06 NOTE — Progress Notes (Signed)
Subjective:     Patient ID: Manuel Wagner, male    DOB: Mar 11, 1971, 50 y.o.   MRN: [id number]  Chief Complaint  Patient presents with   Ear Fullness    Feels like fluid in both ears that started yesterday Has some ringing in ears     HPI Since yesterday, crackling in both ears but more in L. No pain. Some ringing in ears(long time) No congestion. No cough/f/c.  Not sick.  Not using sudafed  Some increase anxiety-dry mouth, trouble sleeping, some heart racing, some SOB. Bp's sl up.  Marital problems  Health Maintenance Due  Topic Date Due   Pneumococcal Vaccine 107-37 Years old (1 - PCV) Never done   Zoster Vaccines- Shingrix (1 of 2) Never done   INFLUENZA VACCINE  11/14/2020    Past Medical History:  Diagnosis Date   Atypical nevus 2009   Back-Moderate (Exc) Derm in [place]   Atypical nevus 2019   [place] Midline[place]Mild [place]   Colon polyp 12/16/2018   Colonoscopy 2013   Essential hypertension 12/16/2018   Family history of colon cancer 12/16/2018   Family history of premature CAD 06/20/2020   Father had bypass at 70 yo, 3 vessel   GERD (gastroesophageal reflux disease) 12/16/2018   Mixed hyperlipidemia 12/16/2018   OSA on CPAP 12/16/2018   Since 2012   Sleep apnea    cpap    Past Surgical History:  Procedure Laterality Date   WISDOM TOOTH EXTRACTION      Outpatient Medications Prior to Visit  Medication Sig Dispense Refill   amLODipine (NORVASC) 10 MG tablet Take 1 tablet (10 mg total) by mouth daily. 90 tablet 3   aspirin 81 MG chewable tablet Chew 81 mg by mouth daily.     esomeprazole (NEXIUM) 20 MG capsule Take 1 capsule (20 mg total) by mouth daily. 90 capsule 3   hydroquinone 4 % cream Apply topically 2 (two) times daily. 28.35 g 2   losartan-hydrochlorothiazide (HYZAAR) 100-25 MG tablet Take 1 tablet by mouth daily. 90 tablet 3   tadalafil (CIALIS) 20 MG tablet Take 0.5-1 tablets (10-20 mg total) by mouth every other day as needed for erectile dysfunction. 90  tablet 0   tretinoin (RETIN-A) 0.05 % cream Apply 1 application topically at bedtime.     nirmatrelvir/ritonavir EUA (PAXLOVID) TABS Take nirmatrelvir (150 mg) two tablets twice daily for 5 days and ritonavir (100 mg) one tablet twice daily for 5 days. 30 tablet 0   rosuvastatin (CRESTOR) 20 MG tablet Take 1 tablet (20 mg total) by mouth daily. 90 tablet 3   amLODipine (NORVASC) 5 MG tablet TAKE 2 TABLETS BY MOUTH EVERY DAY 180 tablet 3   No facility-administered medications prior to visit.    Allergies  Allergen Reactions   Lisinopril Cough   NGE:[id number]/[id number] except as noted in HPI      Objective:     BP 140/76    Pulse 82    Temp 98.3 F (36.8 C) (Temporal)    Ht 5\' 11"  (1.803 m)    Wt 256 lb 2 oz (116.2 kg)    SpO2 98%    BMI 35.72 kg/m  Wt Readings from Last 3 Encounters:  04/06/21 256 lb 2 oz (116.2 kg)  02/03/21 266 lb 9.6 oz (120.9 kg)  10/03/20 267 lb (121.1 kg)        Gen: WDWN NAD HEENT: NCAT, conjunctiva not injected, sclera nonicteric TM WNL R, L-some clear fluid behind TM but  TM clear w/good landmarks OP moist, no exudates  NECK:  supple, no thyromegaly, no nodes, no carotid bruits CARDIAC: RRR, S1S2+, no murmur. DP 2+B LUNGS: CTAB. No wheezes EXT:  no edema MSK: no gross abnormalities.  NEURO: A&O x3.  CN II-XII intact.  PSYCH: normal mood. Good eye contact  Assessment & Plan:   Problem List Items Addressed This Visit   None Visit Diagnoses     Non-recurrent acute serous otitis media of both ears    -  Primary     1.serous OM L.  Flonase.  If pain, changes, consider abx.  Flex eustacian tubes 2.stress-pt working it out. Advised deep breathing, etc.  Doesn't drink caffeine, etc.   Meds ordered this encounter  Medications   fluticasone (FLONASE) 50 MCG/ACT nasal spray    Sig: Place 2 sprays into both nostrils in the morning and at bedtime.    Dispense:  9.9 g    Refill:  1    Wellington Hampshire., MD

## 2021-04-18 ENCOUNTER — Ambulatory Visit: Payer: Commercial Managed Care - PPO | Admitting: Dermatology

## 2021-05-07 ENCOUNTER — Encounter: Payer: Self-pay | Admitting: Family Medicine

## 2021-05-08 ENCOUNTER — Other Ambulatory Visit: Payer: Self-pay

## 2021-05-08 MED ORDER — ROSUVASTATIN CALCIUM 20 MG PO TABS: 20.0000 mg | ORAL_TABLET | Freq: Every day | ORAL | 3 refills | Status: DC

## 2021-06-11 NOTE — Progress Notes (Deleted)
Cardiology Office Note:    Date:  06/11/2021   ID:  Manuel Wagner, DOB 29-Apr-1970, MRN [id number]  PCP:  [name], MD   [place]  Cardiologist:  Freada Bergeron, MD  Advanced Practice Provider:  No care team member to display Electrophysiologist:  None  Referring MD: [name], MD     History of Present Illness:    Manuel Wagner is a 51 y.o. male with a hx of HTN, HLD, OSA on CPAP, and GERD who returns to clinic for follow-up of chest pain.  Patient was in the ED on 02/10/20 with episode of chest pain radiating to the jaw. Trop negative x2. ECG with NSR with low voltage in the anterior leads. Pain improved with treatment with antiacids.   During visit on 03/28/20, the patient continued to have intermittent atypical chest pain. Given strong family history of CAD with father requiring bypass at age 64, we obtained CTA coronaries which showed 0-25% in mid RCA. 25-49% prox LAD, 25-49% D1. Calcium score 250 which is 96% for age and sex matched controls. We started him on crestor 20mg  and ASA 81mg  at that time.  Last seen on 06/13/20 where he was doing well. Underwent endoscopy and was found to have gastritis and esophagitis, which were likely the primary driver of his symptoms. Path negative for malignancy. Symptoms improved with PPI.   Today, ***  Past Medical History:  Diagnosis Date   Atypical nevus 2009   Back-Moderate (Exc) Derm in [place]   Atypical nevus 2019   [facility] Mid[facility]   Colon polyp 12/16/2018   Colonoscopy 2013   Essential hypertension 12/16/2018   Family history of colon cancer 12/16/2018   Family history of premature CAD 06/20/2020   Father had bypass at 16 yo, 3 vessel   GERD (gastroesophageal reflux disease) 12/16/2018   Mixed hyperlipidemia 12/16/2018   OSA on CPAP 12/16/2018   Since 2012   Sleep apnea    cpap    Past Surgical History:  Procedure Laterality Date   WISDOM TOOTH EXTRACTION      Current  Medications: No outpatient medications have been marked as taking for the 06/16/21 encounter (Appointment) with Freada Bergeron, MD.     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[employer], [place]   Occupation: former Water quality scientist for [employer]  Tobacco Use   Smoking status: Never   Smokeless tobacco: Never  Vaping Use   Vaping Use: Never used  Substance and Sexual Activity   Alcohol use: Yes    Comment: ocasionally   Drug use: Never   Sexual activity: Yes  Other Topics Concern   Not on file  Social History Narrative   Relocated from [place] area 2019; originally from [place], [place]   Social Determinants of Radio broadcast assistant Strain: Not on file  Food Insecurity: Not on file  Transportation Needs: Not on file  Physical Activity: Not on file  Stress: Not on file  Social Connections: Not on file     Family History: The patient's family history includes Alcohol abuse in his brother, maternal grandfather, and paternal grandfather; Arthritis in his father; Colon cancer in his father;  Hearing loss in his father; Heart disease in his father; Hyperlipidemia in his brother and mother; Hypertension in his brother and father. There is no history of Esophageal cancer, Rectal cancer, Stomach cancer, or Colon polyps.  ROS:   Please see the history of present illness.    Review of Systems  Constitutional:  Negative for chills, fever and malaise/fatigue.  HENT:  Negative for hearing loss.   Eyes:  Negative for blurred vision and redness.  Respiratory:  Negative for shortness of breath.   Cardiovascular:  Negative for chest pain, palpitations, orthopnea, claudication, leg swelling and PND.  Gastrointestinal:  Negative for melena, nausea and  vomiting.  Genitourinary:  Negative for dysuria and flank pain.  Musculoskeletal:  Negative for myalgias.  Skin:  Negative for rash.  Neurological:  Negative for dizziness and loss of consciousness.  Endo/Heme/Allergies:  Negative for polydipsia.  Psychiatric/Behavioral:  Negative for substance abuse.    EKGs/Labs/Other Studies Reviewed:    The following studies were reviewed today: Coronary CTA 05/26/20: FINDINGS: A 100 kV prospective scan was triggered in the descending thoracic aorta at 111 HU's. Axial non-contrast 3 mm slices were carried out through the heart. The data set was analyzed on a dedicated work station and scored using the [place]. Gantry rotation speed was 250 msecs and collimation was .6 mm. 100 mg of PO Metoprolol and 0.8 mg of sl NTG were given. The 3D data set was reconstructed in 5% intervals of the 67-82 % of the R-R cycle. Diastolic phases were analyzed on a dedicated work station using MPR, MIP and VRT modes. The patient received 80 cc of contrast.   Aorta: Normal size. Minimal diffuse atherosclerotic plaque. No dissection.   Aortic Valve:  Trileaflet.  No calcifications.   Coronary Arteries:  Normal coronary origin.  Right dominance.   RCA is a large dominant artery that gives rise to PDA and PLA. There is minimal diffuse calcified plaque in the proximal and mid RCA with stenosis 0-25%. PDA/PLA have no obvious plaque.   Left main is a large artery that gives rise to LAD and LCX arteries. Left main has no plaque.   LAD is a large vessel that wraps around the apex and give rise to one small diagonal artery. There is mild mixed, predominantly calcified plaque in the proximal LAD with maximum stenosis 25-49%. Mid and distal LAD has no obvious plaque.   D1 has mild ostial calcified plaque with stenosis 25-49%.   LCX is a non-dominant artery that gives rise to one large OM1 branch. There is no plaque.   Other findings:   Normal pulmonary  vein drainage into the left atrium.   Normal left atrial appendage without a thrombus.   Normal size of the pulmonary artery.   IMPRESSION: 1. Coronary calcium score of 250. This was 49 percentile for age and sex matched control.   2. Normal coronary origin with right dominance.   3. CAD-RADS 2. Mild non-obstructive CAD (25-49%). Consider non-atherosclerotic causes of chest pain. Consider preventive therapy and risk factor modification.   Recent Labs: 06/20/2020: ALT 26; BUN 15; Creatinine, Ser 0.76; Hemoglobin 16.4; Platelets 279.0; Potassium 3.8; Sodium 139  Recent Lipid Panel    Component Value Date/Time   CHOL 102 06/20/2020 0911   TRIG 96.0 06/20/2020 0911   HDL 35.90 (L) 06/20/2020 0911   CHOLHDL 3 06/20/2020 0911   VLDL 19.2 06/20/2020 0911   LDLCALC 47 06/20/2020 0911     Physical Exam:    VS:  There were no vitals taken for this visit.    Wt Readings from Last 3 Encounters:  04/06/21 256 lb 2 oz (116.2 kg)  02/03/21 266 lb 9.6 oz (120.9 kg)  10/03/20 267 lb (121.1 kg)     GEN:  Well nourished, well developed in no acute distress HEENT: Normal NECK: No JVD; No carotid bruits CARDIAC: RRR, no murmurs, rubs, gallops RESPIRATORY:  Clear to auscultation without rales, wheezing or rhonchi  ABDOMEN: Soft, non-tender, non-distended MUSCULOSKELETAL:  No edema; No deformity  SKIN: Warm and dry NEUROLOGIC:  Alert and oriented x 3 PSYCHIATRIC:  Normal affect   ASSESSMENT:    No diagnosis found.  PLAN:    In order of problems listed above:  #Non-obstructive multivessel CAD: CTA coronaries showed 0-25% in mid RCA. 25-49% prox LAD, 25-49% D1. Calcium score 250 which is 96% for age and sex matched controls. -Aggressive risk factor modification -Continue ASA 81mg  daily -Continue crestor 20mg  daily -Lifestyle modifications with healthy diet and exercise as detailed below  #Non-cardiac Chest Pain #Esophagitis/Gastritis: EGD with esophagitis/gastritis now on  PPI with resolution of symptoms. -Continue PPI  -Management per GI and primary  #HTN: Well controlled at home. Managed by PCP. -Continue amlodipine 10mg  daily -Continue Losartan-HCTZ 100-12.5mg  daily -Will keep BP log and send in to ensure BP within goal of 120/80s or below   #HLD: -Continue crestor 20mg  daily -Goal LDL<70   #OSA on CPAP: -Continue nightly CPAP   Exercise recommendations: Goal of exercising for at least 30 minutes a day, at least 5 times per week.  Please exercise to a moderate exertion.  This means that while exercising it is difficult to speak in full sentences, however you are not so short of breath that you feel you must stop, and not so comfortable that you can carry on a full conversation.  Exertion level should be approximately a 5/10, if 10 is the most exertion you can perform.  Diet recommendations: Recommend a heart healthy diet such as the Mediterranean diet.  This diet consists of plant based foods, healthy fats, lean meats, olive oil.  It suggests limiting the intake of simple carbohydrates such as white breads, pastries, and pastas.  It also limits the amount of red meat, wine, and dairy products such as cheese that one should consume on a daily basis.      Medication Adjustments/Labs and Tests Ordered: Current medicines are reviewed at length with the patient today.  Concerns regarding medicines are outlined above.  No orders of the defined types were placed in this encounter.  No orders of the defined types were placed in this encounter.   There are no Patient Instructions on file for this visit.    Signed, Freada Bergeron, MD  06/11/2021 8:11 PM    [place]

## 2021-06-16 ENCOUNTER — Encounter: Payer: Self-pay | Admitting: Cardiology

## 2021-06-16 ENCOUNTER — Other Ambulatory Visit: Payer: Self-pay

## 2021-06-16 ENCOUNTER — Ambulatory Visit: Payer: Managed Care, Other (non HMO) | Admitting: Cardiology

## 2021-06-16 VITALS — BP 128/78 | HR 69 | Ht 71.0 in | Wt 251.6 lb

## 2021-06-16 DIAGNOSIS — I2510 Atherosclerotic heart disease of native coronary artery without angina pectoris: Secondary | ICD-10-CM | POA: Diagnosis not present

## 2021-06-16 DIAGNOSIS — E7800 Pure hypercholesterolemia, unspecified: Secondary | ICD-10-CM | POA: Diagnosis not present

## 2021-06-16 DIAGNOSIS — G4733 Obstructive sleep apnea (adult) (pediatric): Secondary | ICD-10-CM | POA: Diagnosis not present

## 2021-06-16 DIAGNOSIS — I10 Essential (primary) hypertension: Secondary | ICD-10-CM | POA: Diagnosis not present

## 2021-06-16 NOTE — Progress Notes (Signed)
Cardiology Office Note:    Date:  06/16/2021   ID:  [name], DOB 1971-04-03, MRN [id number]  PCP:  [name], MD   [place]  Cardiologist:  Freada Bergeron, MD  Advanced Practice Provider:  No care team member to display Electrophysiologist:  None  Referring MD: [name], MD    History of Present Illness:    Manuel Wagner is a 51 y.o. male with a hx of HTN, HLD, OSA on CPAP, and GERD who returns to clinic for follow-up of chest pain.  Patient was in the ED on 02/10/20 with episode of chest pain radiating to the jaw. Trop negative x2. ECG with NSR with low voltage in the anterior leads. Pain improved with treatment with antiacids.   During visit on 03/28/20, the patient continued to have intermittent atypical chest pain. Given strong family history of CAD with father requiring bypass at age 50, we obtained CTA coronaries which showed 0-25% in mid RCA. 25-49% prox LAD, 25-49% D1. Calcium score 250 which is 96% for age and sex matched controls. We started him on crestor 20mg  and ASA 81mg  at that time.  Last seen on 06/13/20 where he was doing well. Underwent endoscopy and was found to have gastritis and esophagitis, which were likely the primary driver of his symptoms. Path negative for malignancy. Symptoms improved with PPI.   Today, the patient states that he is feeling good overall. He denies any recurrent chest pain. He has been taking Nexium occasionally. Has been compliant with all medications. Cholesterol is well controlled.  In the last few months he has successfully lost about 25 lbs. For exercise he is running a couple miles daily for 6 days a week.  Also he is using his CPAP faithfully.  He denies any palpitations, shortness of breath, or peripheral edema. No lightheadedness, headaches, syncope, orthopnea, or PND.   Past Medical History:  Diagnosis Date   Atypical nevus 2009   Back-Moderate (Exc) Derm in [place]   Atypical  nevus 2019   Back Midline[place]Mild [place]   Colon polyp 12/16/2018   Colonoscopy 2013   Essential hypertension 12/16/2018   Family history of colon cancer 12/16/2018   Family history of premature CAD 06/20/2020   Father had bypass at 17 yo, 3 vessel   GERD (gastroesophageal reflux disease) 12/16/2018   Mixed hyperlipidemia 12/16/2018   OSA on CPAP 12/16/2018   Since 2012   Sleep apnea    cpap    Past Surgical History:  Procedure Laterality Date   WISDOM TOOTH EXTRACTION      Current Medications: Current Meds  Medication Sig   amLODipine (NORVASC) 10 MG tablet Take 1 tablet (10 mg total) by mouth daily.   aspirin 81 MG chewable tablet Chew 81 mg by mouth daily.   esomeprazole (NEXIUM) 20 MG capsule Take 1 capsule (20 mg total) by mouth daily.   hydroquinone 4 % cream Apply topically 2 (two) times daily.   losartan-hydrochlorothiazide (HYZAAR) 100-25 MG tablet Take 1 tablet by mouth daily.   rosuvastatin (CRESTOR) 20 MG tablet Take 1 tablet (20 mg total) by mouth daily.   tadalafil (CIALIS) 20 MG tablet Take 0.5-1 tablets (10-20 mg total) by mouth every other day as needed for erectile dysfunction.   tretinoin (RETIN-A) 0.05 % cream Apply 1 application topically at bedtime.     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[employer], [employer]   Occupation: former Water quality scientist for [employer]  Tobacco Use   Smoking status: Never   Smokeless tobacco: Never  Vaping Use   Vaping Use: Never used  Substance and Sexual Activity   Alcohol use: Yes    Comment: ocasionally   Drug use: Never   Sexual activity: Yes  Other Topics Concern   Not on file  Social History Narrative   Relocated from [place] area 2019; originally from [place], [place]   Social  Determinants of Radio broadcast assistant Strain: Not on [employer] Insecurity: Not on file  Transportation Needs: Not on file  Physical Activity: Not on file  Stress: Not on file  Social Connections: Not on file     Family History: The patient's family history includes Alcohol abuse in his brother, maternal grandfather, and paternal grandfather; Arthritis in his father; Colon cancer in his father; Hearing loss in his father; Heart disease in his father; Hyperlipidemia in his brother and mother; Hypertension in his brother and father. There is no history of Esophageal cancer, Rectal cancer, Stomach cancer, or Colon polyps.  ROS:   Please see the history of present illness.    Review of Systems  Constitutional:  Negative for chills, fever and malaise/fatigue.  HENT:  Negative for hearing loss.   Eyes:  Negative for blurred vision and redness.  Respiratory:  Negative for shortness of breath.   Cardiovascular:  Negative for chest pain, palpitations, orthopnea, claudication, leg swelling and PND.  Gastrointestinal:  Negative for melena, nausea and vomiting.  Genitourinary:  Negative for dysuria and flank pain.  Musculoskeletal:  Negative for myalgias.  Skin:  Negative for rash.  Neurological:  Negative for dizziness and loss of consciousness.  Endo/Heme/Allergies:  Negative for polydipsia.  Psychiatric/Behavioral:  Negative for substance abuse.    EKGs/Labs/Other Studies Reviewed:    The following studies were reviewed today:  Coronary CTA 05/26/20: FINDINGS: A 100 kV prospective scan was triggered in the descending thoracic aorta at 111 HU's. Axial non-contrast 3 mm slices were carried out through the heart. The data set was analyzed on a dedicated work station and scored using the [place]. Gantry rotation speed was 250 msecs and collimation was .6 mm. 100 mg of PO Metoprolol and 0.8 mg of sl NTG were given. The 3D data set was reconstructed in 5% intervals of the 67-82  % of the R-R cycle. Diastolic phases were analyzed on a dedicated work station using MPR, MIP and VRT modes. The patient received 80 cc of contrast.   Aorta: Normal size. Minimal diffuse atherosclerotic plaque. No dissection.   Aortic Valve:  Trileaflet.  No calcifications.   Coronary Arteries:  Normal coronary origin.  Right dominance.   RCA is a large dominant artery that gives rise to PDA and PLA. There is minimal diffuse calcified plaque in the proximal and mid RCA with stenosis 0-25%. PDA/PLA have no obvious plaque.   Left main is a large artery that gives rise to LAD and LCX arteries. Left main has no plaque.   LAD is a large vessel that wraps around the apex and give rise to one small diagonal artery. There is mild mixed, predominantly calcified plaque in the proximal LAD with maximum stenosis 25-49%. Mid and distal LAD has  no obvious plaque.   D1 has mild ostial calcified plaque with stenosis 25-49%.   LCX is a non-dominant artery that gives rise to one large OM1 branch. There is no plaque.   Other findings:   Normal pulmonary vein drainage into the left atrium.   Normal left atrial appendage without a thrombus.   Normal size of the pulmonary artery.   IMPRESSION: 1. Coronary calcium score of 250. This was 72 percentile for age and sex matched control.   2. Normal coronary origin with right dominance.   3. CAD-RADS 2. Mild non-obstructive CAD (25-49%). Consider non-atherosclerotic causes of chest pain. Consider preventive therapy and risk factor modification.   EKG:   EKG is personally reviewed. 06/16/2021: Sinus rhythm. Rate 69 bpm.  Recent Labs: 06/20/2020: ALT 26; BUN 15; Creatinine, Ser 0.76; Hemoglobin 16.4; Platelets 279.0; Potassium 3.8; Sodium 139   Recent Lipid Panel    Component Value Date/Time   CHOL 102 06/20/2020 0911   TRIG 96.0 06/20/2020 0911   HDL 35.90 (L) 06/20/2020 0911   CHOLHDL 3 06/20/2020 0911   VLDL 19.2 06/20/2020 0911    LDLCALC 47 06/20/2020 0911     Physical Exam:    VS:  BP 128/78    Pulse 69    Ht 5\' 11"  (1.803 m)    Wt 251 lb 9.6 oz (114.1 kg)    SpO2 97%    BMI 35.09 kg/m     Wt Readings from Last 3 Encounters:  06/16/21 251 lb 9.6 oz (114.1 kg)  04/06/21 256 lb 2 oz (116.2 kg)  02/03/21 266 lb 9.6 oz (120.9 kg)     GEN:  Well nourished, well developed in no acute distress HEENT: Normal NECK: No JVD; No carotid bruits CARDIAC: RRR, no murmurs, rubs, gallops RESPIRATORY:  Clear to auscultation without rales, wheezing or rhonchi  ABDOMEN: Soft, non-tender, non-distended MUSCULOSKELETAL:  No edema; No deformity  SKIN: Warm and dry NEUROLOGIC:  Alert and oriented x 3 PSYCHIATRIC:  Normal affect   ASSESSMENT:    1. Coronary artery disease involving native coronary artery of native heart without angina pectoris   2. Essential hypertension   3. Obstructive sleep apnea   4. Pure hypercholesterolemia     PLAN:    In order of problems listed above:  #Non-obstructive multivessel CAD: CTA coronaries showed 0-25% in mid RCA. 25-49% prox LAD, 25-49% D1. Calcium score 250 which is 96% for age and sex matched controls. -Aggressive risk factor modification -Continue ASA 81mg  daily -Continue crestor 20mg  daily -Lifestyle modifications with healthy diet and exercise as detailed below  #Non-cardiac Chest Pain #Esophagitis/Gastritis: EGD with esophagitis/gastritis now on PPI with resolution of symptoms. -Continue PPI  -Management per GI and primary  #HTN: Well controlled at home. Managed by PCP. -Continue amlodipine 10mg  daily -Continue Losartan-HCTZ 100-12.5mg  daily -Will keep BP log and send in to ensure BP within goal of 120/80s or below   #HLD: -Continue crestor 20mg  daily -Will repeat labs with PCP -Goal LDL<70   #OSA on CPAP: -Continue nightly CPAP   Exercise recommendations: Goal of exercising for at least 30 minutes a day, at least 5 times per week.  Please exercise to a  moderate exertion.  This means that while exercising it is difficult to speak in full sentences, however you are not so short of breath that you feel you must stop, and not so comfortable that you can carry on a full conversation.  Exertion level should be approximately a 5/10, if 10 is  the most exertion you can perform.  Diet recommendations: Recommend a heart healthy diet such as the Mediterranean diet.  This diet consists of plant based foods, healthy fats, lean meats, olive oil.  It suggests limiting the intake of simple carbohydrates such as white breads, pastries, and pastas.  It also limits the amount of red meat, wine, and dairy products such as cheese that one should consume on a daily basis.    Follow-up:   1 year.  Medication Adjustments/Labs and Tests Ordered: Current medicines are reviewed at length with the patient today.  Concerns regarding medicines are outlined above.   Orders Placed This Encounter  Procedures   EKG 12-Lead   No orders of the defined types were placed in this encounter.  Patient Instructions  Medication Instructions:   Your physician recommends that you continue on your current medications as directed. Please refer to the Current Medication list given to you today.  *If you need a refill on your cardiac medications before your next appointment, please call your pharmacy*   Follow-Up: At [facility], you and your health needs are our priority.  As part of our continuing mission to provide you with exceptional heart care, we have created designated Provider Care Teams.  These Care Teams include your primary Cardiologist (physician) and Advanced Practice Providers (APPs -  Physician Assistants and Nurse Practitioners) who all work together to provide you with the care you need, when you need it.  We recommend signing up for the patient portal called "MyChart".  Sign up information is provided on this After Visit Summary.  MyChart is used to connect with  patients for Virtual Visits (Telemedicine).  Patients are able to view lab/test results, encounter notes, upcoming appointments, etc.  Non-urgent messages can be sent to your provider as well.   To learn more about what you can do with MyChart, go to [email or web address].    Your next appointment:   1 year(s)  The format for your next appointment:   In Person  Provider:   Freada Bergeron, MD {     [facility] Stumpf,acting as a scribe for Freada Bergeron, MD.,have documented all relevant documentation on the behalf of Freada Bergeron, MD,as directed by  Freada Bergeron, MD while in the presence of Freada Bergeron, MD.  I, Freada Bergeron, MD, have reviewed all documentation for this visit. The documentation on 06/16/21 for the exam, diagnosis, procedures, and orders are all accurate and complete.    Signed, Freada Bergeron, MD  06/16/2021 10:26 AM    [facility]

## 2021-06-16 NOTE — Patient Instructions (Signed)
Medication Instructions:  ? ?Your physician recommends that you continue on your current medications as directed. Please refer to the Current Medication list given to you today. ? ?*If you need a refill on your cardiac medications before your next appointment, please call your pharmacy* ? ? ?Follow-Up: ?At [facility], you and your health needs are our priority.  As part of our continuing mission to provide you with exceptional heart care, we have created designated Provider Care Teams.  These Care Teams include your primary Cardiologist (physician) and Advanced Practice Providers (APPs -  Physician Assistants and Nurse Practitioners) who all work together to provide you with the care you need, when you need it. ? ?We recommend signing up for the patient portal called "MyChart".  Sign up information is provided on this After Visit Summary.  MyChart is used to connect with patients for Virtual Visits (Telemedicine).  Patients are able to view lab/test results, encounter notes, upcoming appointments, etc.  Non-urgent messages can be sent to your provider as well.   ?To learn more about what you can do with MyChart, go to [email or web address].   ? ?Your next appointment:   ?1 year(s) ? ?The format for your next appointment:   ?In Person ? ?Provider:   ?Freada Bergeron, MD { ? ? ? ?

## 2021-06-16 NOTE — Telephone Encounter (Signed)
Insurance card information printed off and given to registration girl who checked him in for his OV with Dr. Johney Frame this morning.  ?

## 2021-07-06 ENCOUNTER — Encounter: Payer: Commercial Managed Care - PPO | Admitting: Family Medicine

## 2021-07-31 ENCOUNTER — Encounter: Payer: Self-pay | Admitting: Family Medicine

## 2021-08-01 ENCOUNTER — Encounter: Payer: Self-pay | Admitting: Family Medicine

## 2021-08-01 ENCOUNTER — Ambulatory Visit: Payer: Managed Care, Other (non HMO) | Admitting: Family Medicine

## 2021-08-01 VITALS — BP 130/76 | HR 71 | Temp 98.6°F | Wt 251.0 lb

## 2021-08-01 DIAGNOSIS — L03211 Cellulitis of face: Secondary | ICD-10-CM | POA: Diagnosis not present

## 2021-08-01 MED ORDER — DOXYCYCLINE HYCLATE 100 MG PO TABS: 100.0000 mg | ORAL_TABLET | Freq: Two times a day (BID) | ORAL | 0 refills | Status: AC

## 2021-08-01 NOTE — Patient Instructions (Signed)
Please follow up as scheduled for your next visit with me: 09/04/2021  ? ?Take the antibiotics as directed and let me know if things don't resolve.  ? ?If you have any questions or concerns, please don't hesitate to send me a message via MyChart or call the office at [phone]. Thank you for visiting with [place] today! It's our pleasure caring for you.  ? ? ?

## 2021-08-01 NOTE — Progress Notes (Signed)
? ?Subjective  ?CC:  ?Chief Complaint  ?Patient presents with  ? Insect Bite  ?  Pt stated that he has a lump on the Lt side of his neck and thinks its an insect bite. The lump came up last night. It is very itchy.  ? ? ?HPI: Manuel Wagner is a 51 y.o. male who presents to the office today to address the problems listed above in the chief complaint. ?51 year old male with hypertension and hyperlipidemia presents due to red area on left lower cheek and enlarged left lymph node.  He noticed some itching and irritation on the left cheek about 2 days ago.  Since the area has become slightly raised, red and minimally sore.  Earlier this morning he noted an enlarged lymph node that continues to grow.  It is nontender.  He has had no fevers or systemic symptoms.  He does shave and there is a small abrasion but no other injury noted.  No sore throat ear pain or sinus symptoms.  No other facial or scalp lesions. ? ?Assessment  ?1. Cellulitis of face   ? ?  ?Plan  ?Infection/cellulitis face: Suspect cut skin while shaving and now is infected.  Has secondary lymphadenopathy.  Education given.  Advil as needed and doxycycline 100 twice daily x7 days. ? ?Follow up: As scheduled, sooner if symptoms do not resolve. ?09/04/2021 ? ?No orders of the defined types were placed in this encounter. ? ?Meds ordered this encounter  ?Medications  ? doxycycline (VIBRA-TABS) 100 MG tablet  ?  Sig: Take 1 tablet (100 mg total) by mouth 2 (two) times daily for 7 days.  ?  Dispense:  14 tablet  ?  Refill:  0  ? ?  ? ?I reviewed the patients updated PMH, FH, and SocHx.  ?  ?Patient Active Problem List  ? Diagnosis Date Noted  ? Coronary artery disease involving native coronary artery of native heart without angina pectoris 06/20/2020  ?  Priority: High  ? Family history of premature CAD 06/20/2020  ?  Priority: High  ? Family history of colon cancer 12/16/2018  ?  Priority: High  ? Adenomatous polyp 12/16/2018  ?  Priority: High  ? Mixed  hyperlipidemia 12/16/2018  ?  Priority: High  ? White coat syndrome with hypertension 12/16/2018  ?  Priority: High  ? OSA on CPAP 12/16/2018  ?  Priority: High  ? Obesity (BMI 30-39.9) 12/16/2018  ?  Priority: High  ? GERD (gastroesophageal reflux disease) 12/16/2018  ?  Priority: Medium   ? ED (erectile dysfunction) 12/16/2018  ?  Priority: Low  ? ?Current Meds  ?Medication Sig  ? amLODipine (NORVASC) 10 MG tablet Take 1 tablet (10 mg total) by mouth daily.  ? aspirin 81 MG chewable tablet Chew 81 mg by mouth daily.  ? doxycycline (VIBRA-TABS) 100 MG tablet Take 1 tablet (100 mg total) by mouth 2 (two) times daily for 7 days.  ? esomeprazole (NEXIUM) 20 MG capsule Take 1 capsule (20 mg total) by mouth daily.  ? fluticasone (FLONASE) 50 MCG/ACT nasal spray Place 2 sprays into both nostrils in the morning and at bedtime.  ? hydroquinone 4 % cream Apply topically 2 (two) times daily.  ? losartan-hydrochlorothiazide (HYZAAR) 100-25 MG tablet Take 1 tablet by mouth daily.  ? rosuvastatin (CRESTOR) 20 MG tablet Take 1 tablet (20 mg total) by mouth daily.  ? tadalafil (CIALIS) 20 MG tablet Take 0.5-1 tablets (10-20 mg total) by mouth every other day as  needed for erectile dysfunction.  ? tretinoin (RETIN-A) 0.05 % cream Apply 1 application topically at bedtime.  ? ? ?Allergies: ?Patient is allergic to lisinopril. ?Family History: ?Patient family history includes Alcohol abuse in his brother, maternal grandfather, and paternal grandfather; Arthritis in his father; Colon cancer in his father; Hearing loss in his father; Heart disease in his father; Hyperlipidemia in his brother and mother; Hypertension in his brother and father. ?Social History:  ?Patient  reports that he has never smoked. He has never used smokeless tobacco. He reports current alcohol use. He reports that he does not use drugs. ? ?Review of Systems: ?Constitutional: Negative for fever malaise or anorexia ?Cardiovascular: negative for chest  pain ?Respiratory: negative for SOB or persistent cough ?Gastrointestinal: negative for abdominal pain ? ?Objective  ?Vitals: BP 130/76   Pulse 71   Temp 98.6 ?F (37 ?C)   Wt 251 lb (113.9 kg)   SpO2 98%   BMI 35.01 kg/m?  ?General: no acute distress , A&Ox3 ?HEENT: Oropharynx clear, left TM normal, left lower cheek with 2 dime sized erythematous areas with a small abrasion and one of them.  Left cervical lymphadenopathy present with 1 lymph node about the size of a small marble.  Minimally tender.  No other neck masses palpated. ? ? ?Commons side effects, risks, benefits, and alternatives for medications and treatment plan prescribed today were discussed, and the patient expressed understanding of the given instructions. Patient is instructed to call or message via MyChart if he/she has any questions or concerns regarding our treatment plan. No barriers to understanding were identified. We discussed Red Flag symptoms and signs in detail. Patient expressed understanding regarding what to do in case of urgent or emergency type symptoms.  ?Medication list was reconciled, printed and provided to the patient in AVS. Patient instructions and summary information was reviewed with the patient as documented in the AVS. ?This note was prepared with assistance of Systems analyst. Occasional wrong-word or sound-a-like substitutions may have occurred due to the inherent limitations of voice recognition software ? ?This visit occurred during the SARS-CoV-2 public health emergency.  Safety protocols were in place, including screening questions prior to the visit, additional usage of staff PPE, and extensive cleaning of exam room while observing appropriate contact time as indicated for disinfecting solutions.  ? ?

## 2021-08-18 ENCOUNTER — Other Ambulatory Visit: Payer: Self-pay | Admitting: Family Medicine

## 2021-08-31 ENCOUNTER — Telehealth: Payer: Self-pay | Admitting: Family Medicine

## 2021-08-31 NOTE — Telephone Encounter (Signed)
Message sent thru MyChart 

## 2021-08-31 NOTE — Telephone Encounter (Signed)
Patient had to r/s his cpe due to being out of town- cpe has been sch for 12/07/21 at 1130am - patient would like to know if he should come in for blood work before for medication review or ok to wait till august.

## 2021-09-04 ENCOUNTER — Encounter: Payer: Managed Care, Other (non HMO) | Admitting: Family Medicine

## 2021-11-04 ENCOUNTER — Other Ambulatory Visit: Payer: Self-pay | Admitting: Family Medicine

## 2021-11-05 ENCOUNTER — Encounter: Payer: Self-pay | Admitting: Family Medicine

## 2021-11-06 ENCOUNTER — Encounter: Payer: Self-pay | Admitting: Physician Assistant

## 2021-11-06 ENCOUNTER — Telehealth (INDEPENDENT_AMBULATORY_CARE_PROVIDER_SITE_OTHER): Payer: Managed Care, Other (non HMO) | Admitting: Physician Assistant

## 2021-11-06 VITALS — HR 88 | Temp 97.8°F | Ht 71.0 in | Wt 251.0 lb

## 2021-11-06 DIAGNOSIS — U071 COVID-19: Secondary | ICD-10-CM

## 2021-11-06 MED ORDER — NIRMATRELVIR/RITONAVIR (PAXLOVID)TABLET: 3.0000 | ORAL_TABLET | Freq: Two times a day (BID) | ORAL | 0 refills | Status: AC

## 2021-11-06 NOTE — Progress Notes (Signed)
   Virtual Visit via Video Note  I connected with  Manuel Wagner  on 11/06/21 at  8:00 AM EDT by a video enabled telemedicine application and verified that I am speaking with the correct person using two identifiers.  Location: Patient: home Provider: Therapist, music at [place] present: Patient and myself   I discussed the limitations of evaluation and management by telemedicine and the availability of in person appointments. The patient expressed understanding and agreed to proceed.   History of Present Illness:  Chief complaint: COVID-19 positive via home test Symptom onset: 11/02/21 Pertinent positives: Productive cough, nasal and sinus congestion, some fatigue  Pertinent negatives: SOB, CP, headache, dizziness, n/v/d/ abd pain Treatments tried: Tylenol, Coricidin HBP Sick exposure: Recent travel    Observations/Objective:  SpO2 98% room air  Gen: Awake, alert, no acute distress, very congested Resp: Breathing is even and non-labored Psych: calm/pleasant demeanor Neuro: Alert and Oriented x 3, + facial symmetry, speech is clear.   Assessment and Plan:  1. COVID-19 Diagnosis confirmed via home antigen test. We discussed current algorithm recommendations for prescribing outpatient antivirals.  Patient is within 5 day window of symptom onset and requesting Paxlovid. Risks versus benefits discussed.  Advised self-isolation at home for the first 5 days of symptoms and then masking around others for at least an additional 5 days.  Treat supportively at this time including sleeping prone, deep breathing exercises, pushing fluids, walking every few hours, vitamins C and D, and Tylenol or ibuprofen as needed.  The patient understands that COVID-19 illness can wax and wane.  Should the symptoms acutely worsen or patient starts to experience sudden shortness of breath, chest pain, severe weakness, the patient will go straight to the emergency department.  Also  advised home pulse oximetry monitoring and for any reading consistently under 92%, should also report to the emergency department.  The patient will continue to keep [place] updated.   Follow Up Instructions:    I discussed the assessment and treatment plan with the patient. The patient was provided an opportunity to ask questions and all were answered. The patient agreed with the plan and demonstrated an understanding of the instructions.   The patient was advised to call back or seek an in-person evaluation if the symptoms worsen or if the condition fails to improve as anticipated.  Ivyanna Sibert M Atreyu Mak, PA-C

## 2021-12-07 ENCOUNTER — Encounter: Payer: Self-pay | Admitting: Family Medicine

## 2021-12-07 ENCOUNTER — Ambulatory Visit: Payer: Managed Care, Other (non HMO) | Admitting: Family Medicine

## 2021-12-07 VITALS — BP 120/68 | HR 71 | Temp 98.3°F | Ht 71.0 in | Wt 249.8 lb

## 2021-12-07 DIAGNOSIS — E782 Mixed hyperlipidemia: Secondary | ICD-10-CM | POA: Diagnosis not present

## 2021-12-07 DIAGNOSIS — G4733 Obstructive sleep apnea (adult) (pediatric): Secondary | ICD-10-CM

## 2021-12-07 DIAGNOSIS — I2510 Atherosclerotic heart disease of native coronary artery without angina pectoris: Secondary | ICD-10-CM | POA: Diagnosis not present

## 2021-12-07 DIAGNOSIS — Z9989 Dependence on other enabling machines and devices: Secondary | ICD-10-CM

## 2021-12-07 DIAGNOSIS — Z0000 Encounter for general adult medical examination without abnormal findings: Secondary | ICD-10-CM | POA: Diagnosis not present

## 2021-12-07 DIAGNOSIS — Z800 Family history of malignant neoplasm of digestive organs: Secondary | ICD-10-CM

## 2021-12-07 DIAGNOSIS — I10 Essential (primary) hypertension: Secondary | ICD-10-CM

## 2021-12-07 DIAGNOSIS — K219 Gastro-esophageal reflux disease without esophagitis: Secondary | ICD-10-CM

## 2021-12-07 DIAGNOSIS — D369 Benign neoplasm, unspecified site: Secondary | ICD-10-CM

## 2021-12-07 DIAGNOSIS — Z23 Encounter for immunization: Secondary | ICD-10-CM

## 2021-12-07 DIAGNOSIS — M67431 Ganglion, right wrist: Secondary | ICD-10-CM

## 2021-12-07 LAB — COMPREHENSIVE METABOLIC PANEL
ALT: 21 U/L (ref 0–53)
AST: 21 U/L (ref 0–37)
Albumin: 4.6 g/dL (ref 3.5–5.2)
Alkaline Phosphatase: 56 U/L (ref 39–117)
BUN: 11 mg/dL (ref 6–23)
CO2: 25 mEq/L (ref 19–32)
Calcium: 9.7 mg/dL (ref 8.4–10.5)
Chloride: 101 mEq/L (ref 96–112)
Creatinine, Ser: 0.77 mg/dL (ref 0.40–1.50)
GFR: 104.14 mL/min (ref >60.00)
Glucose, Bld: 88 mg/dL (ref 70–99)
Potassium: 4.2 mEq/L (ref 3.5–5.1)
Sodium: 138 mEq/L (ref 135–145)
Total Bilirubin: 1.3 mg/dL — ABNORMAL HIGH (ref 0.2–1.2)
Total Protein: 7.7 g/dL (ref 6.0–8.3)

## 2021-12-07 LAB — LIPID PANEL
Cholesterol: 109 mg/dL (ref 0–200)
HDL: 41.2 mg/dL (ref >39.00)
LDL Cholesterol: 47 mg/dL (ref 0–99)
NonHDL: 67.84
Total CHOL/HDL Ratio: 3
Triglycerides: 103 mg/dL (ref 0.0–149.0)
VLDL: 20.6 mg/dL (ref 0.0–40.0)

## 2021-12-07 LAB — CBC WITH DIFFERENTIAL/PLATELET
Basophils Absolute: 0.1 10*3/uL (ref 0.0–0.1)
Basophils Relative: 0.8 % (ref 0.0–3.0)
Eosinophils Absolute: 0.2 10*3/uL (ref 0.0–0.7)
Eosinophils Relative: 1.9 % (ref 0.0–5.0)
HCT: 48.3 % (ref 39.0–52.0)
Hemoglobin: 16.6 g/dL (ref 13.0–17.0)
Lymphocytes Relative: 31.1 % (ref 12.0–46.0)
Lymphs Abs: 3 10*3/uL (ref 0.7–4.0)
MCHC: 34.3 g/dL (ref 30.0–36.0)
MCV: 84.9 fl (ref 78.0–100.0)
Monocytes Absolute: 0.5 10*3/uL (ref 0.1–1.0)
Monocytes Relative: 5 % (ref 3.0–12.0)
Neutro Abs: 5.9 10*3/uL (ref 1.4–7.7)
Neutrophils Relative %: 61.2 % (ref 43.0–77.0)
Platelets: 268 10*3/uL (ref 150.0–400.0)
RBC: 5.69 Mil/uL (ref 4.22–5.81)
RDW: 14.5 % (ref 11.5–15.5)
WBC: 9.6 10*3/uL (ref 4.0–10.5)

## 2021-12-07 LAB — TSH: TSH: 1.42 u[IU]/mL (ref 0.35–5.50)

## 2021-12-07 NOTE — Patient Instructions (Signed)
Please return in 12 months for your annual complete physical; please come fasting.  Keep an eye on your home blood pressure readings.   I will release your lab results to you on your MyChart account with further instructions. You may see the results before I do, but when I review them I will send you a message with my report or have my assistant call you if things need to be discussed. Please reply to my message with any questions. Thank you!   Today you were given your flu and 1st of 2 Shingrix vaccinations.  Schedule a nurse visit in 2-6 months to receive your 2nd shingrix vaccine.  If you have any questions or concerns, please don't hesitate to send me a message via MyChart or call the office at [phone]. Thank you for visiting with [place] today! It's our pleasure caring for you.   Ganglion Cyst  A ganglion cyst is a non-cancerous, fluid-filled lump of tissue that occurs near a joint, tendon, or ligament. The cyst grows out of a joint or the lining of a tendon or ligament. Ganglion cysts most often develop in the hand or wrist, but they can also develop in the shoulder, elbow, hip, knee, ankle, or foot. Ganglion cysts are ball-shaped or egg-shaped. Their size can range from the size of a pea to larger than a grape. Increased activity may cause the cyst to get bigger because more fluid starts to build up. What are the causes? The exact cause of this condition is not known, but it may be related to: Inflammation or irritation around the joint. An injury or tear in the layers of tissue around the joint (joint capsule). Repetitive movements or overuse. History of acute or repeated injury. What increases the risk? You are more likely to develop this condition if: You are a male. You are 35-58 years old. What are the signs or symptoms? The main symptom of this condition is a lump. It most often appears on the hand or wrist. In many cases, there are no other symptoms, but a cyst can sometimes  cause: Tingling. Pain or tenderness. Numbness. Weakness or loss of strength in the affected joint. Decreased range of motion in the affected area of the body. How is this diagnosed? Ganglion cysts are usually diagnosed based on a physical exam. Your health care provider will feel the lump and may shine a light next to it. If it is a ganglion cyst, the light will likely shine through it. Your health care provider may order an X-ray, ultrasound, MRI, or CT scan to rule out other conditions. How is this treated? Ganglion cysts often go away on their own without treatment. If you have pain or other symptoms, treatment may be needed. Treatment is also needed if the ganglion cyst limits your movement or if it gets infected. Treatment may include: Wearing a brace or splint on your wrist or finger. Taking anti-inflammatory medicine. Having fluid drained from the lump with a needle (aspiration). Getting an injection of medicine into the joint to decrease inflammation. This may be corticosteroids, ethanol, or hyaluronidase. Having surgery to remove the ganglion cyst. Placing a pad in your shoe or wearing shoes that will not rub against the cyst if it is on your foot. Follow these instructions at home: Do not press on the ganglion cyst, poke it with a needle, or hit it. Take over-the-counter and prescription medicines only as told by your health care provider. If you have a brace or splint: Wear it  as told by your health care provider. Remove it as told by your health care provider. Ask if you need to remove it when you take a shower or a bath. Watch your ganglion cyst for any changes. Keep all follow-up visits as told by your health care provider. This is important. Contact a health care provider if: Your ganglion cyst becomes larger or more painful. You have pus coming from the lump. You have weakness or numbness in the affected area. You have a fever or chills. Get help right away if: You have  a fever and have any of these in the cyst area: Increased redness. Red streaks. Swelling. Summary A ganglion cyst is a non-cancerous, fluid-filled lump that occurs near a joint, tendon, or ligament. Ganglion cysts most often develop in the hand or wrist, but they can also develop in the shoulder, elbow, hip, knee, ankle, or foot. Ganglion cysts often go away on their own without treatment. This information is not intended to replace advice given to you by your health care provider. Make sure you discuss any questions you have with your health care provider. Document Revised: 06/24/2019 Document Reviewed: 06/24/2019 Elsevier Patient Education  [place].

## 2021-12-07 NOTE — Progress Notes (Signed)
Subjective  Chief Complaint  Patient presents with   Annual Exam    Pt here for Annual Exam and is currently fasting     HPI: Manuel Wagner is a 51 y.o. male who presents to [facility] today for a Male Wellness Visit. He also has the concerns and/or needs as listed above in the chief complaint. These will be addressed in addition to the Health Maintenance Visit.   Wellness Visit: annual visit with health maintenance review and exam   HM: 51 year old working on healthy lifestyle.  Has started running regularly.  Weight is down 20 pounds in the last year.  Eating better.  Feeling better.  Colon cancer screening is up-to-date.  Eligible for Shingrix vaccinations and flu shot today.  Body mass index is 34.84 kg/m. Wt Readings from Last 3 Encounters:  12/07/21 249 lb 12.8 oz (113.3 kg)  11/06/21 251 lb (113.9 kg)  08/01/21 251 lb (113.9 kg)  09/2020 265 lb   Chronic disease management visit and/or acute problem visit: Ganglion cyst right wrist, volar: Has been there for several years.  Intermittently will bump it but otherwise not significantly painful.  Would like to know options of care. CAD and hypertension with strong family history of premature coronary disease: Reviewed cardiology notes.  No chest pain.  Continues on aspirin, statin and blood pressure medications. Hypertension with whitecoat component: Normal blood pressure in the office today.  Continues on amlodipine 10 mg daily and Hyzaar 100/25 daily.  No chest pain or palpitations.  No lower extremity edema.  No pain on exertion Family history of colon cancer history of adenomatous polyp: Colonoscopy due next in 2025. Tolerating CPAP for sleep apnea. Continues on chronic PPI for GERD.  Reports this is well controlled.  Patient Active Problem List   Diagnosis Date Noted   Coronary artery disease involving native coronary artery of native heart without angina pectoris 06/20/2020   Family history of  premature CAD 06/20/2020   Family history of colon cancer 12/16/2018   Adenomatous polyp 12/16/2018   Mixed hyperlipidemia 12/16/2018   White coat syndrome with hypertension 12/16/2018   OSA on CPAP 12/16/2018   Obesity (BMI 30-39.9) 12/16/2018   GERD (gastroesophageal reflux disease) 12/16/2018   ED (erectile dysfunction) 12/16/2018   Ganglion cyst of wrist, right 12/07/2021   Health Maintenance  Topic Date Due   COVID-19 Vaccine (5 - Pfizer risk series) 12/23/2021 (Originally 05/02/2021)   Zoster Vaccines- Shingrix (2 of 2) 02/01/2022   COLONOSCOPY (Pts 45-74yr Insurance coverage will need to be confirmed)  03/18/2024   TETANUS/TDAP  10/20/2026   INFLUENZA VACCINE  Completed   Hepatitis C Screening  Completed   HIV Screening  Completed   HPV VACCINES  Aged Out   Immunization History  Administered Date(s) Administered   Hepatitis A, Adult 01/20/2004, 07/07/2015   Hepatitis B, adult 08/09/2005, 09/11/2005, 03/05/2006   Influenza Split 03/05/2006, 04/03/2007, 01/07/2008, 03/25/2009, 01/27/2010, 01/17/2011, 03/20/2012   Influenza,inj,Quad PF,6+ Mos 02/16/2018, 12/16/2018, 12/17/2019, 12/07/2021   Influenza,inj,quad, With Preservative 04/20/2013, 01/15/2014, 01/29/2015, 01/13/2016, 02/16/2018   PFIZER(Purple Top)SARS-COV-2 Vaccination 06/23/2019, 07/21/2019, 04/05/2020   Pfizer Covid-19 Vaccine Bivalent Booster 160yr& up 03/07/2021   Tdap 04/03/2007, 10/19/2016   Zoster Recombinat (Shingrix) 12/07/2021   We updated and reviewed the patient's past history in detail and it is documented below. Allergies: Patient is allergic to lisinopril. Past Medical History  has a past medical history of Atypical nevus (2009), Atypical nevus (2019), Colon polyp (12/16/2018), Essential hypertension (  12/16/2018), Family history of colon cancer (12/16/2018), Family history of premature CAD (06/20/2020), GERD (gastroesophageal reflux disease) (12/16/2018), Mixed hyperlipidemia (12/16/2018), OSA on CPAP  (12/16/2018), and Sleep apnea. Past Surgical History Patient  has a past surgical history that includes Wisdom tooth extraction. Social History Patient  reports that he has never smoked. He has never used smokeless tobacco. He reports current alcohol use. He reports that he does not use drugs. Family History family history includes Alcohol abuse in his brother, maternal grandfather, and paternal grandfather; Arthritis in his father; Colon cancer in his father; Hearing loss in his father; Heart disease in his father; Hyperlipidemia in his brother and mother; Hypertension in his brother and father. Review of Systems: Constitutional: negative for fever or malaise Ophthalmic: negative for photophobia, double vision or loss of vision Cardiovascular: negative for chest pain, dyspnea on exertion, or new LE swelling Respiratory: negative for SOB or persistent cough Gastrointestinal: negative for abdominal pain, change in bowel habits or melena Genitourinary: negative for dysuria or gross hematuria Musculoskeletal: negative for new gait disturbance or muscular weakness Integumentary: negative for new or persistent rashes Neurological: negative for TIA or stroke symptoms Psychiatric: negative for SI or delusions Allergic/Immunologic: negative for hives  Patient Care Team    Relationship Specialty Notifications Start End  [name], MD PCP - General Family Medicine  12/16/18   Freada Bergeron, MD PCP - Cardiology Cardiology  03/28/20   Thornton Park, MD Consulting Physician Gastroenterology  03/30/19   Janne Napoleon, PA-C (Inactive)  Dermatology  09/24/19   Lavonna Monarch, MD Consulting Physician Dermatology  04/13/20    Objective  Vitals: BP 120/68   Pulse 71   Temp 98.3 F (36.8 C)   Ht '5\' 11"'$  (1.803 m)   Wt 249 lb 12.8 oz (113.3 kg)   SpO2 98%   BMI 34.84 kg/m  General:  Well developed, well nourished, no acute distress  Psych:  Alert and orientedx3,normal mood and  affect HEENT:  Normocephalic, atraumatic, non-icteric sclera, PERRL, oropharynx is clear without mass or exudate, supple neck without adenopathy, mass or thyromegaly Cardiovascular:  Normal S1, S2, RRR without gallop, rub or murmur, nondisplaced PMI, +2 distal pulses in bilateral upper and lower extremities. Respiratory:  Good breath sounds bilaterally, CTAB with normal respiratory effort Gastrointestinal: normal bowel sounds, soft, non-tender, no noted masses. No HSM MSK: no deformities, contusions. Joints are without erythema or swelling. Spine and CVA region are nontender Skin:  Warm, no rashes or suspicious lesions noted Neurologic:    Mental status is normal. CN 2-11 are normal. Gross motor and sensory exams are normal. Stable gait. No tremor GU: No inguinal hernias or adenopathy are appreciated bilaterally   Assessment  1. Annual physical exam   2. Need for influenza vaccination   3. Need for shingles vaccine   4. White coat syndrome with hypertension   5. Mixed hyperlipidemia   6. OSA on CPAP   7. Coronary artery disease involving native coronary artery of native heart without angina pectoris   8. Gastroesophageal reflux disease, unspecified whether esophagitis present   9. Family history of colon cancer   10. Adenomatous polyp   11. Ganglion cyst of wrist, right      Plan  Male Wellness Visit: Age appropriate Health Maintenance and Prevention measures were discussed with patient. Included topics are cancer screening recommendations, ways to keep healthy (see AVS) including dietary and exercise recommendations, regular eye and dental care, use of seat belts, and avoidance of moderate alcohol  use and tobacco use.  Screens are current BMI: discussed patient's BMI and encouraged positive lifestyle modifications to help get to or maintain a target BMI. HM needs and immunizations were addressed and ordered. See below for orders. See HM and immunization section for updates.  First of 2  Shingrix vaccinations given today along with flu vaccine.  Counseling done Routine labs and screening tests ordered including cmp, cbc and lipids where appropriate. Discussed recommendations regarding Vit D and calcium supplementation (see AVS)  Chronic disease f/u and/or acute problem visit: (deemed necessary to be done in addition to the wellness visit): CAD and hypertension: Well-controlled, continue amlodipine 10 and Hyzaar 100/25 daily.  Check renal function electrolytes today.  Continue baby aspirin Hyperlipidemia on Crestor 20: Tolerating well.  Push LDL less than 70.  Recheck today.  Monitor LFTs. Family history of colon cancer and adenomatous polyps: Due for colonoscopy in 2025.  No symptoms GERD on chronic PPI.  This medical condition is well controlled. There are no signs of complications, medication side effects, or red flags. Patient is instructed to continue the current treatment plan without change in therapies or medications. Ganglion cyst: Discussed options of care.  Patient will consider.  He will follow-up with me if he needs further assistance Continue CPAP for OSA.  Follow up: 12 months for complete physical.  He will monitor blood pressures at home.  6 months for second Shingrix vaccine Commons side effects, risks, benefits, and alternatives for medications and treatment plan prescribed today were discussed, and the patient expressed understanding of the given instructions. Patient is instructed to call or message via MyChart if he/she has any questions or concerns regarding our treatment plan. No barriers to understanding were identified. We discussed Red Flag symptoms and signs in detail. Patient expressed understanding regarding what to do in case of urgent or emergency type symptoms.  Medication list was reconciled, printed and provided to the patient in AVS. Patient instructions and summary information was reviewed with the patient as documented in the AVS. This note was  prepared with assistance of Dragon voice recognition software. Occasional wrong-word or sound-a-like substitutions may have occurred due to the inherent limitations of voice recognition software  This visit occurred during the SARS-CoV-2 public health emergency.  Safety protocols were in place, including screening questions prior to the visit, additional usage of staff PPE, and extensive cleaning of exam room while observing appropriate contact time as indicated for disinfecting solutions.   Orders Placed This Encounter  Procedures   Flu Vaccine QUAD 6+ mos PF IM (Fluarix Quad PF)   Zoster Recombinant (Shingrix )   CBC with Differential/Platelet   Comprehensive metabolic panel   Lipid panel   TSH   No orders of the defined types were placed in this encounter.

## 2021-12-08 ENCOUNTER — Encounter: Payer: Self-pay | Admitting: Cardiology

## 2021-12-08 NOTE — Telephone Encounter (Signed)
Patient sent another MyChart message with labs attached, labs forwarded to Dr. Johney Frame for review.

## 2022-02-13 ENCOUNTER — Encounter: Payer: Self-pay | Admitting: Family Medicine

## 2022-02-13 ENCOUNTER — Other Ambulatory Visit: Payer: Self-pay

## 2022-02-13 MED ORDER — ROSUVASTATIN CALCIUM 20 MG PO TABS: 20.0000 mg | ORAL_TABLET | Freq: Every day | ORAL | 2 refills | Status: DC

## 2022-02-13 MED ORDER — AMLODIPINE BESYLATE 10 MG PO TABS: 10.0000 mg | ORAL_TABLET | Freq: Every day | ORAL | 3 refills | Status: DC

## 2022-02-13 MED ORDER — LOSARTAN POTASSIUM-HCTZ 100-25 MG PO TABS: 1.0000 | ORAL_TABLET | Freq: Every day | ORAL | 3 refills | Status: DC

## 2022-02-16 IMAGING — CT CT HEART MORP W/ CTA COR W/ SCORE W/ CA W/CM &/OR W/O CM
4 of 7 series · 8 of 20 positions shown, 9 images · non-contrast
Comparison: None.
COMPARISON: None.

Addendum:
EXAM:
OVER-READ INTERPRETATION  CT CHEST

The following report is an over-read performed by radiologist Dr.
Yigitefe Gio [[email or web address]] on 05/26/2020. This
over-read does not include interpretation of cardiac or coronary
anatomy or pathology. The coronary calcium score/coronary CTA
interpretation by the cardiologist is attached.
CLINICAL DATA: 49-year-old male with h/o hypertension,
hyperlipidemia and chest pain.
Cardiac/Coronary  CTA
TECHNIQUE: The patient was scanned on a [employer] Force scanner.

[Series 6: best diast · axial · 0.43mm/px · z∈[-199,-159]mm · 2 of 301 slices shown, 3 images]
[im 101/301  vessel]
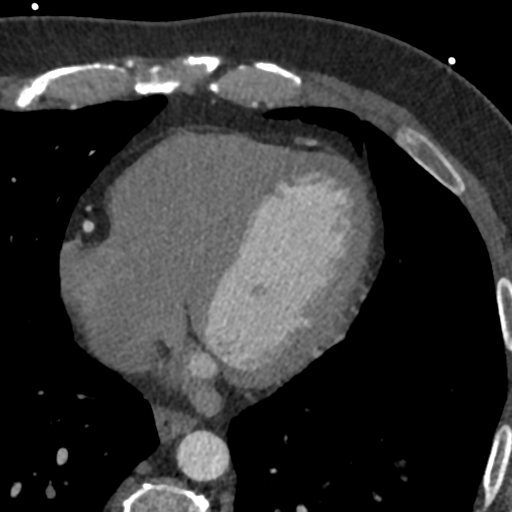
[im 101/301  lung]
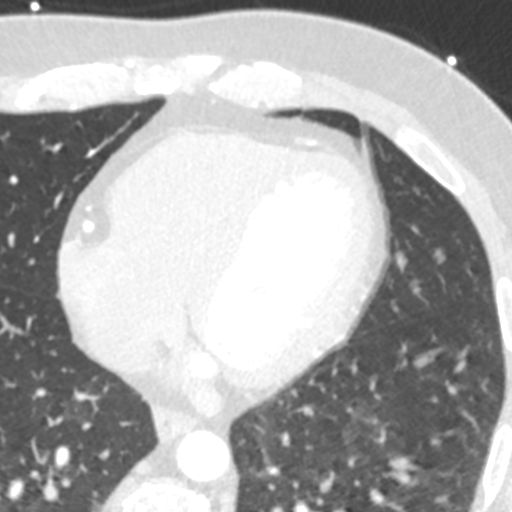
[im 201/301  vessel]
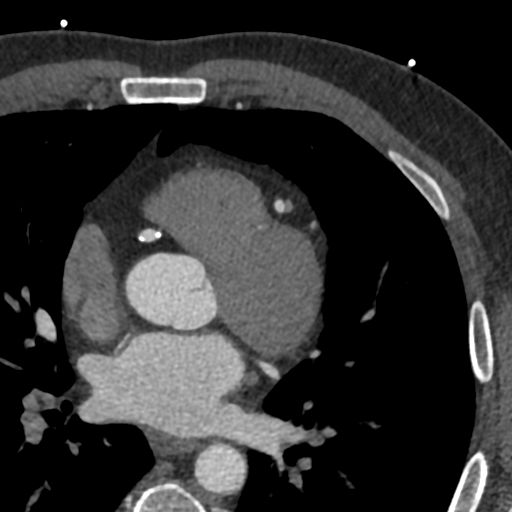

[Series 7: best syst · axial · 0.43mm/px · z∈[-199,-159]mm · 2 of 301 slices shown]
[im 101/301  vessel]
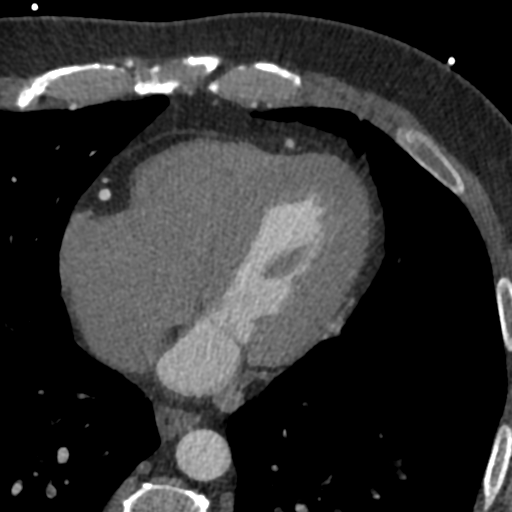
[im 201/301  vessel]
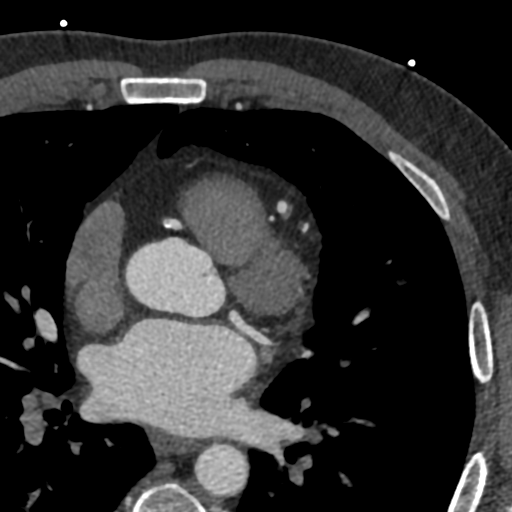

[Series 8: ts diast sharp 67 % · axial · 0.43mm/px · z∈[-199,-159]mm · 2 of 301 slices shown]
[im 101/301  lung]
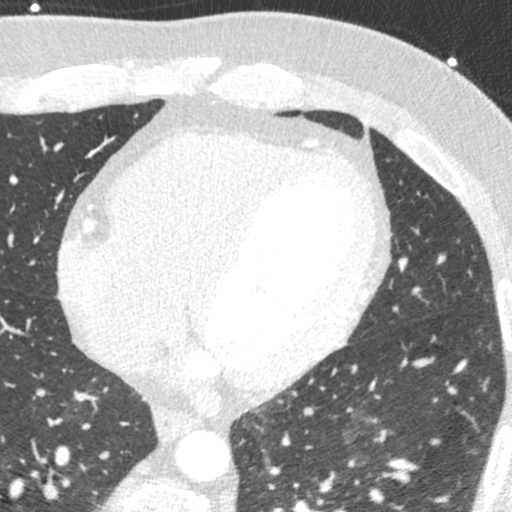
[im 201/301  lung]
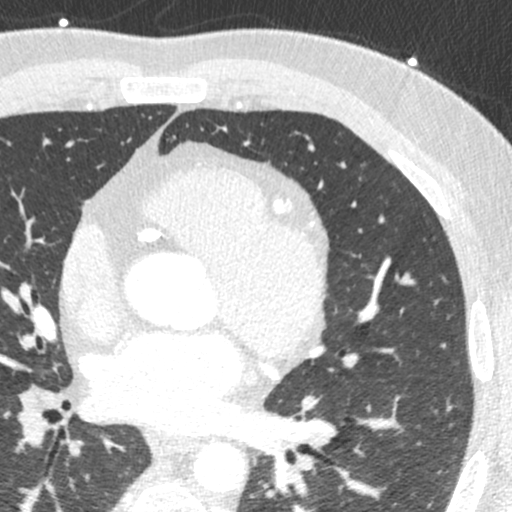

[Series 9: ts syst sharp · axial · 0.43mm/px · z∈[-199,-159]mm · 2 of 301 slices shown]
[im 101/301  lung]
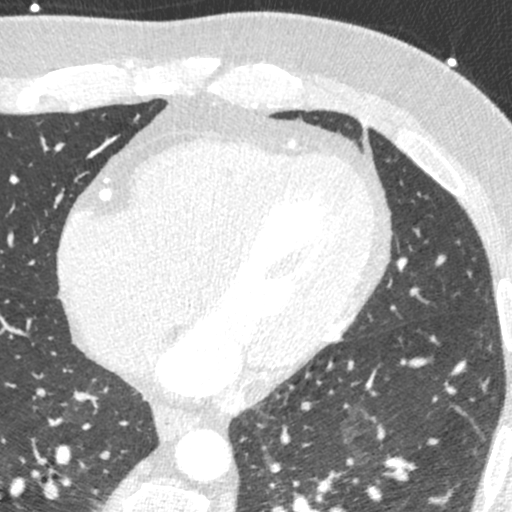
[im 201/301  lung]
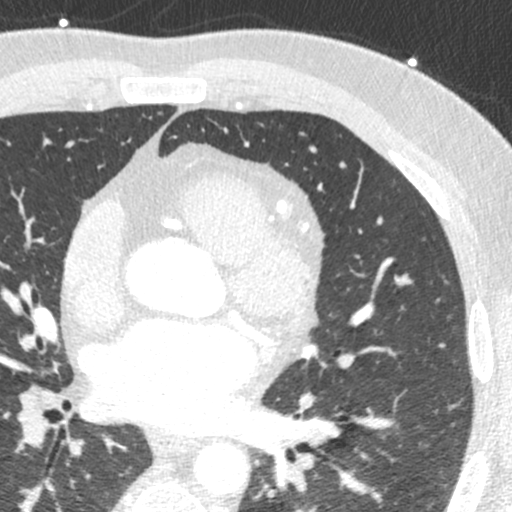

[8 of 20 positions shown; findings below may reference images not displayed]

FINDINGS: Within the visualized portions of the thorax there are no suspicious
appearing pulmonary nodules or masses, there is no acute
consolidative airspace disease, no pleural effusions, no
pneumothorax and no lymphadenopathy. Visualized portions of the
upper abdomen demonstrates some coarse calcifications in the medial
aspect of the spleen. There are no aggressive appearing lytic or
blastic lesions noted in the visualized portions of the skeleton.
IMPRESSION: No significant incidental noncardiac findings.
FINDINGS: A 100 kV prospective scan was triggered in the descending thoracic
aorta at 111 HU's. Axial non-contrast 3 mm slices were carried out
through the heart. The data set was analyzed on a dedicated work
station and scored using the Agatson method. Gantry rotation speed
was 250 msecs and collimation was .6 mm. 100 mg of PO Metoprolol and
0.8 mg of sl NTG were given. The 3D data set was reconstructed in 5%
intervals of the 67-82 % of the R-R cycle. Diastolic phases were
analyzed on a dedicated work station using MPR, MIP and VRT modes.
The patient received 80 cc of contrast.

Aorta: Normal size. Minimal diffuse atherosclerotic plaque. No
dissection.

Aortic Valve:  Trileaflet.  No calcifications.

Coronary Arteries:  Normal coronary origin.  Right dominance.

RCA is a large dominant artery that gives rise to PDA and PLA. There
is minimal diffuse calcified plaque in the proximal and mid RCA with
stenosis 0-25%. PDA/PLA have no obvious plaque.

Left main is a large artery that gives rise to LAD and LCX arteries.
Left main has no plaque.

LAD is a large vessel that wraps around the apex and give rise to
one small diagonal artery. There is mild mixed, predominantly
calcified plaque in the proximal LAD with maximum stenosis 25-49%.
Mid and distal LAD has no obvious plaque.

D1 has mild ostial calcified plaque with stenosis 25-49%.

LCX is a non-dominant artery that gives rise to one large OM1
branch. There is no plaque.

Other findings:

Normal pulmonary vein drainage into the left atrium.

Normal left atrial appendage without a thrombus.

Normal size of the pulmonary artery.
IMPRESSION: 1. Coronary calcium score of 250. This was 96 percentile for age and
sex matched control.

2. Normal coronary origin with right dominance.

3. CAD-RADS 2. Mild non-obstructive CAD (25-49%). Consider
non-atherosclerotic causes of chest pain. Consider preventive
therapy and risk factor modification.

*** End of Addendum ***
EXAM:
OVER-READ INTERPRETATION  CT CHEST

The following report is an over-read performed by radiologist Dr.
Yigitefe Gio [REDACTED] on 05/26/2020. This
over-read does not include interpretation of cardiac or coronary
anatomy or pathology. The coronary calcium score/coronary CTA
interpretation by the cardiologist is attached.
FINDINGS: Within the visualized portions of the thorax there are no suspicious
appearing pulmonary nodules or masses, there is no acute
consolidative airspace disease, no pleural effusions, no
pneumothorax and no lymphadenopathy. Visualized portions of the
upper abdomen demonstrates some coarse calcifications in the medial
aspect of the spleen. There are no aggressive appearing lytic or
blastic lesions noted in the visualized portions of the skeleton.
IMPRESSION: No significant incidental noncardiac findings.

## 2022-05-16 ENCOUNTER — Ambulatory Visit (INDEPENDENT_AMBULATORY_CARE_PROVIDER_SITE_OTHER): Payer: Managed Care, Other (non HMO) | Admitting: *Deleted

## 2022-05-16 DIAGNOSIS — Z23 Encounter for immunization: Secondary | ICD-10-CM

## 2022-05-16 NOTE — Progress Notes (Signed)
Pt here for 2nd shingrix vaccine. Given in the Left deltoid. Tolerated well.

## 2022-05-18 ENCOUNTER — Other Ambulatory Visit: Payer: Self-pay | Admitting: Family Medicine

## 2022-06-03 ENCOUNTER — Encounter: Payer: Self-pay | Admitting: Family Medicine

## 2022-06-30 ENCOUNTER — Encounter: Payer: Self-pay | Admitting: Family Medicine

## 2022-07-03 ENCOUNTER — Other Ambulatory Visit: Payer: Self-pay

## 2022-07-03 MED ORDER — LOSARTAN POTASSIUM-HCTZ 100-25 MG PO TABS: 1.0000 | ORAL_TABLET | Freq: Every day | ORAL | 3 refills | Status: DC

## 2022-07-03 MED ORDER — ROSUVASTATIN CALCIUM 20 MG PO TABS: ORAL_TABLET | ORAL | 2 refills | Status: DC

## 2022-07-03 MED ORDER — AMLODIPINE BESYLATE 10 MG PO TABS: 10.0000 mg | ORAL_TABLET | Freq: Every day | ORAL | 3 refills | Status: DC

## 2022-07-13 ENCOUNTER — Encounter: Payer: Self-pay | Admitting: Family Medicine

## 2022-07-25 ENCOUNTER — Other Ambulatory Visit (HOSPITAL_COMMUNITY): Payer: Self-pay

## 2022-07-25 ENCOUNTER — Telehealth: Payer: Self-pay

## 2022-07-25 ENCOUNTER — Ambulatory Visit: Payer: Managed Care, Other (non HMO) | Admitting: Family Medicine

## 2022-07-25 ENCOUNTER — Encounter: Payer: Self-pay | Admitting: Family Medicine

## 2022-07-25 ENCOUNTER — Telehealth: Payer: Self-pay | Admitting: Family Medicine

## 2022-07-25 VITALS — BP 138/78 | HR 78 | Temp 98.5°F | Ht 71.0 in | Wt 261.0 lb

## 2022-07-25 DIAGNOSIS — E669 Obesity, unspecified: Secondary | ICD-10-CM

## 2022-07-25 DIAGNOSIS — I10 Essential (primary) hypertension: Secondary | ICD-10-CM | POA: Diagnosis not present

## 2022-07-25 MED ORDER — ZEPBOUND 2.5 MG/0.5ML ~~LOC~~ SOAJ: 2.5000 mg | SUBCUTANEOUS | 1 refills | Status: DC

## 2022-07-25 NOTE — Progress Notes (Signed)
Subjective  CC:  Chief Complaint  Patient presents with   Weight Loss    Discuss medication     HPI: Manuel Wagner is a 52 y.o. male who presents to the office today to address the problems listed above in the chief complaint. See mychart message: weight loss: had done well last year with diet and exercise; weight was down .... But has regained.,  Father passed away in May 26, 2022, he was back and forth for a while.  Diet was affected.  Exercises affected.  Now requesting GLP-1 agonist to see if he can get some help. He has exercised and worked on diet for months. No other weight loss meds tried.  Blood pressures have been controlled at home. Taking meds. No cp.  Assessment  1. Obesity (BMI 30-39.9)   2. White coat syndrome with hypertension      Plan  obeisty:  educated on healthy diet and exercise. Discussed actions and uses of glp-1 agonists. Discussed appropriate use. Zepbound ordered.  HTN: monitor bp; mildly elevated but white coat component. Should also improve with weight loss. No change in meds at this time.   Follow up: 8 weeks for weight and bp recheck  Visit date not found  No orders of the defined types were placed in this encounter.  Meds ordered this encounter  Medications   tirzepatide (ZEPBOUND) 2.5 MG/0.5ML Pen    Sig: Inject 2.5 mg into the skin once a week.    Dispense:  2 mL    Refill:  1      I reviewed the patients updated PMH, FH, and SocHx.    Patient Active Problem List   Diagnosis Date Noted   Coronary artery disease involving native coronary artery of native heart without angina pectoris 06/20/2020    Priority: High   Family history of premature CAD 06/20/2020    Priority: High   Family history of colon cancer 12/16/2018    Priority: High   Adenomatous polyp 12/16/2018    Priority: High   Mixed hyperlipidemia 12/16/2018    Priority: High   White coat syndrome with hypertension 12/16/2018    Priority: High   OSA on CPAP 12/16/2018     Priority: High   Obesity (BMI 30-39.9) 12/16/2018    Priority: High   GERD (gastroesophageal reflux disease) 12/16/2018    Priority: Medium    ED (erectile dysfunction) 12/16/2018    Priority: Low   Ganglion cyst of wrist, right 12/07/2021   Current Meds  Medication Sig   amLODipine (NORVASC) 10 MG tablet Take 1 tablet (10 mg total) by mouth daily.   aspirin 81 MG chewable tablet Chew 81 mg by mouth daily.   esomeprazole (NEXIUM) 20 MG capsule Take 1 capsule (20 mg total) by mouth daily.   fluticasone (FLONASE) 50 MCG/ACT nasal spray Place 2 sprays into both nostrils in the morning and at bedtime.   hydroquinone 4 % cream Apply topically 2 (two) times daily.   losartan-hydrochlorothiazide (HYZAAR) 100-25 MG tablet Take 1 tablet by mouth daily.   rosuvastatin (CRESTOR) 20 MG tablet TAKE 1 TABLET(20 MG) BY MOUTH DAILY   tadalafil (CIALIS) 20 MG tablet Take 0.5-1 tablets (10-20 mg total) by mouth every other day as needed for erectile dysfunction.   tirzepatide (ZEPBOUND) 2.5 MG/0.5ML Pen Inject 2.5 mg into the skin once a week.   tretinoin (RETIN-A) 0.05 % cream Apply 1 application topically at bedtime.    Allergies: Patient is allergic to lisinopril. Family History: Patient family  history includes Alcohol abuse in his brother, maternal grandfather, and paternal grandfather; Arthritis in his father; Colon cancer in his father; Hearing loss in his father; Heart disease in his father; Hyperlipidemia in his brother and mother; Hypertension in his brother and father. Social History:  Patient  reports that he has never smoked. He has never used smokeless tobacco. He reports current alcohol use. He reports that he does not use drugs.  Review of Systems: Constitutional: Negative for fever malaise or anorexia Cardiovascular: negative for chest pain Respiratory: negative for SOB or persistent cough Gastrointestinal: negative for abdominal pain  Objective  Vitals: BP 138/78 (BP Location:  Left Arm, Patient Position: Sitting)   Pulse 78   Temp 98.5 F (36.9 C) (Skin)   Ht 5\' 11"  (1.803 m)   Wt 261 lb (118.4 kg)   SpO2 98%   BMI 36.40 kg/m  General: no acute distress , A&Ox3 Wt Readings from Last 3 Encounters:  07/25/22 261 lb (118.4 kg)  12/07/21 249 lb 12.8 oz (113.3 kg)  11/06/21 251 lb (113.9 kg)    Commons side effects, risks, benefits, and alternatives for medications and treatment plan prescribed today were discussed, and the patient expressed understanding of the given instructions. Patient is instructed to call or message via MyChart if he/she has any questions or concerns regarding our treatment plan. No barriers to understanding were identified. We discussed Red Flag symptoms and signs in detail. Patient expressed understanding regarding what to do in case of urgent or emergency type symptoms.  Medication list was reconciled, printed and provided to the patient in AVS. Patient instructions and summary information was reviewed with the patient as documented in the AVS. This note was prepared with assistance of Dragon voice recognition software. Occasional wrong-word or sound-a-like substitutions may have occurred due to the inherent limitations of voice recognition software

## 2022-07-25 NOTE — Telephone Encounter (Signed)
Please complete PA

## 2022-07-25 NOTE — Patient Instructions (Signed)
Please return in 2 months for recheck.   It does show that there may be a prior authorization requirement ... We will see.   If you have any questions or concerns, please don't hesitate to send me a message via MyChart or call the office at [phone]. Thank you for visiting with [place] today! It's our pleasure caring for you.

## 2022-07-25 NOTE — Telephone Encounter (Signed)
Pharmacy Patient Advocate Encounter   Received notification that prior authorization for Zepbound 2.5MG /0.5ML pen-injectors is required/requested.  Per Test Claim: PA required   PA submitted on 07/25/22 to (ins) [employer] via CoverMyMeds Key or [facility]) confirmation # [id number] Status is pending

## 2022-07-25 NOTE — Telephone Encounter (Signed)
PA submitted via CMM. Key: [id number]. Status pending

## 2022-07-25 NOTE — Telephone Encounter (Signed)
Patient states per his insurance a Prior Authorization is required for  tirzepatide (ZEPBOUND) 2.5 MG/0.5ML Pen   Patient requests to be called with status of the above

## 2022-07-27 ENCOUNTER — Other Ambulatory Visit (HOSPITAL_COMMUNITY): Payer: Self-pay

## 2022-07-27 NOTE — Telephone Encounter (Signed)
Pharmacy Patient Advocate Encounter   Prior Authorization for Zepbound has been approved by [employer] (ins).     PA # [id number] Effective dates: 07/25/2022 through 03/24/2023 

## 2022-07-30 ENCOUNTER — Telehealth: Payer: Self-pay | Admitting: Family Medicine

## 2022-07-30 ENCOUNTER — Other Ambulatory Visit: Payer: Self-pay

## 2022-07-30 MED ORDER — ZEPBOUND 2.5 MG/0.5ML ~~LOC~~ SOAJ: 2.5000 mg | SUBCUTANEOUS | 1 refills | Status: DC

## 2022-07-30 NOTE — Telephone Encounter (Signed)
Ramon Dredge with [employer] states he is calling on behalf of Patient to send RX for  tirzepatide (ZEPBOUND) 2.5 MG/0.5ML Pen  to be sent to  [facility] - [place], [place] - [address] Phone: [phone]  Fax: [phone]

## 2022-07-30 NOTE — Telephone Encounter (Signed)
Zepbpound has been sent to [facility] on [address]

## 2022-07-30 NOTE — Telephone Encounter (Signed)
Pharmacy Patient Advocate Encounter   Prior Authorization for Zepbound has been approved by [place] (ins).     PA # [id number] Effective dates: 07/25/2022 through 03/24/2023

## 2022-07-30 NOTE — Telephone Encounter (Signed)
Rx sent 

## 2022-07-30 NOTE — Telephone Encounter (Signed)
Patient states, Manuel Wagner from [facility], later informed him that they longer have Wegovy in stock. Pt is requesting rx for [name] be re-routed to [facility] [address] For now.

## 2022-08-16 ENCOUNTER — Encounter: Payer: Self-pay | Admitting: Family Medicine

## 2022-08-17 NOTE — Telephone Encounter (Signed)
Please see previous encounter for approval.

## 2022-08-17 NOTE — Telephone Encounter (Signed)
Pt called for an update on medication authorization. States can't get the medication until this is done.

## 2022-08-20 ENCOUNTER — Encounter: Payer: Self-pay | Admitting: Family Medicine

## 2022-08-21 ENCOUNTER — Other Ambulatory Visit: Payer: Self-pay

## 2022-08-21 MED ORDER — ZEPBOUND 2.5 MG/0.5ML ~~LOC~~ SOAJ: 2.5000 mg | SUBCUTANEOUS | 0 refills | Status: DC

## 2022-08-21 NOTE — Telephone Encounter (Signed)
Pt called back and was wondering how the process is going with RX. Pt would like a call back. Please advise.

## 2022-08-23 ENCOUNTER — Other Ambulatory Visit: Payer: Self-pay

## 2022-08-23 ENCOUNTER — Other Ambulatory Visit (HOSPITAL_COMMUNITY): Payer: Self-pay

## 2022-08-23 MED ORDER — ZEPBOUND 2.5 MG/0.5ML ~~LOC~~ SOAJ: 2.5000 mg | SUBCUTANEOUS | 0 refills | Status: DC

## 2022-08-23 NOTE — Telephone Encounter (Signed)
Caller states:  - Patient's PA for zepbound 2.5 mg was approved-- only covers a year  - In order for patient to get weekly fills a patient level authorization is needed- this can be completed via covermymeds or call [phone].

## 2022-08-23 NOTE — Telephone Encounter (Signed)
Elease Hashimoto spoke with [facility] and Elease Hashimoto will take care of the rest and make sure that [facility] and [facility]illed. PA will be sent PA team for update. Patient will be informed by Elease Hashimoto once an answered by PA team.

## 2022-08-24 NOTE — Telephone Encounter (Signed)
Patient came into the office this morning.  States he spoke with [place].  [employer] has stated they would cover the script for 2.5mg  for 90 days with a PA processed.  I have spoken with Joline Salt. She is going to call the insurance company and follow back up with myself.

## 2022-08-28 ENCOUNTER — Other Ambulatory Visit (HOSPITAL_COMMUNITY): Payer: Self-pay

## 2022-08-29 ENCOUNTER — Encounter: Payer: Self-pay | Admitting: Family Medicine

## 2022-08-31 ENCOUNTER — Other Ambulatory Visit (HOSPITAL_COMMUNITY): Payer: Self-pay

## 2022-08-31 MED ORDER — ZEPBOUND 5 MG/0.5ML ~~LOC~~ SOAJ
5.0000 mg | SUBCUTANEOUS | 5 refills | Status: DC
  Filled 2022-08-31: qty 2, 28d supply, fill #0
  Filled 2022-09-26: qty 2, 28d supply, fill #1
  Filled 2022-10-24 – 2022-10-30 (×2): qty 2, 28d supply, fill #2
  Filled 2022-11-25: qty 2, 28d supply, fill #3

## 2022-08-31 NOTE — Telephone Encounter (Signed)
Changed to 5 mg dose

## 2022-09-26 ENCOUNTER — Other Ambulatory Visit (HOSPITAL_COMMUNITY): Payer: Self-pay

## 2022-10-01 ENCOUNTER — Encounter: Payer: Self-pay | Admitting: Family Medicine

## 2022-10-01 ENCOUNTER — Other Ambulatory Visit: Payer: Self-pay | Admitting: Family Medicine

## 2022-10-01 ENCOUNTER — Other Ambulatory Visit: Payer: Self-pay

## 2022-10-01 ENCOUNTER — Ambulatory Visit: Payer: Managed Care, Other (non HMO) | Admitting: Family Medicine

## 2022-10-01 MED ORDER — ROSUVASTATIN CALCIUM 20 MG PO TABS: ORAL_TABLET | ORAL | 11 refills | Status: DC

## 2022-10-01 NOTE — Telephone Encounter (Signed)
Rx sent 

## 2022-10-03 ENCOUNTER — Other Ambulatory Visit: Payer: Self-pay

## 2022-10-03 ENCOUNTER — Ambulatory Visit: Payer: Managed Care, Other (non HMO) | Admitting: Family Medicine

## 2022-10-03 MED ORDER — ROSUVASTATIN CALCIUM 20 MG PO TABS: ORAL_TABLET | ORAL | 2 refills | Status: DC

## 2022-10-03 NOTE — Telephone Encounter (Signed)
90day supply sent.

## 2022-10-03 NOTE — Telephone Encounter (Signed)
Patient called requesting for 30 day supply to be changed to 90 day supply. States they can only fill 90 day supply of meds.

## 2022-10-05 ENCOUNTER — Ambulatory Visit: Payer: Managed Care, Other (non HMO) | Admitting: Family Medicine

## 2022-10-05 ENCOUNTER — Encounter: Payer: Self-pay | Admitting: Family Medicine

## 2022-10-05 VITALS — BP 122/78 | HR 78 | Temp 97.6°F | Ht 71.0 in | Wt 246.6 lb

## 2022-10-05 DIAGNOSIS — Z6830 Body mass index (BMI) 30.0-30.9, adult: Secondary | ICD-10-CM | POA: Diagnosis not present

## 2022-10-05 DIAGNOSIS — N529 Male erectile dysfunction, unspecified: Secondary | ICD-10-CM | POA: Diagnosis not present

## 2022-10-05 DIAGNOSIS — I10 Essential (primary) hypertension: Secondary | ICD-10-CM | POA: Diagnosis not present

## 2022-10-05 DIAGNOSIS — E782 Mixed hyperlipidemia: Secondary | ICD-10-CM

## 2022-10-05 DIAGNOSIS — E669 Obesity, unspecified: Secondary | ICD-10-CM

## 2022-10-05 MED ORDER — ROSUVASTATIN CALCIUM 20 MG PO TABS: ORAL_TABLET | ORAL | 3 refills | Status: DC

## 2022-10-05 MED ORDER — TADALAFIL 20 MG PO TABS: 10.0000 mg | ORAL_TABLET | ORAL | 11 refills | Status: AC | PRN

## 2022-10-05 NOTE — Patient Instructions (Signed)
Please return in 3-6 months for your annual complete physical; please come fasting.   Let me know when you need a refill of your mounjaro: 5mg  or 7.5mg  dose.   If you have any questions or concerns, please don't hesitate to send me a message via MyChart or call the office at [phone]. Thank you for visiting with [place] today! It's our pleasure caring for you.

## 2022-10-05 NOTE — Progress Notes (Signed)
Subjective  CC:  Chief Complaint  Patient presents with   Obesity    HPI: Manuel Wagner is a 52 y.o. male who presents to the office today to address the problems listed above in the chief complaint. Obesity: On his second month of Mounjaro.  Taking 5 mg weekly.  Completed his second dose.  Weight is down 15 pounds.  He has had no side effects.  Is working very well.  Feels great.  Happy with results. White coat hypertension: Continues to take home blood pressures.  Blood pressure in office today looks good.  120s over 70s.  No chest pain or shortness of breath.  On ACE diuretic combination. ED: Uses Cialis as needed.  Works well.  No contraindications.  No side effects Hyperlipidemia on rosuvastatin: Needs refill.  Well-tolerated.  No myalgias.  Wt Readings from Last 3 Encounters:  10/05/22 246 lb 9.6 oz (111.9 kg)  07/25/22 261 lb (118.4 kg)  12/07/21 249 lb 12.8 oz (113.3 kg)    Assessment  1. Obesity (BMI 30-39.9)   2. White coat syndrome with hypertension   3. Erectile dysfunction, unspecified erectile dysfunction type   4. Mixed hyperlipidemia      Plan  Obesity: Excellent response to Mounjaro.  Will continue 5 mg for 2 months, then adjust up if needed for weight loss.  He will let me know through MyChart.  No side effects. Hypertension is well-controlled on current medications.  Will continue to lose weight and we will monitor.  May be able to adjust back dosing Refilled rosuvastatin for hyperlipidemia.  Will recheck fasting levels at physical ED: Cialis refilled.  Follow up: 3 to 6 months for complete physical Visit date not found  No orders of the defined types were placed in this encounter.  Meds ordered this encounter  Medications   rosuvastatin (CRESTOR) 20 MG tablet    Sig: TAKE 1 TABLET DAILY    Dispense:  90 tablet    Refill:  3   tadalafil (CIALIS) 20 MG tablet    Sig: Take 0.5-1 tablets (10-20 mg total) by mouth every other day as needed for erectile  dysfunction.    Dispense:  30 tablet    Refill:  11      I reviewed the patients updated PMH, FH, and SocHx.    Patient Active Problem List   Diagnosis Date Noted   Coronary artery disease involving native coronary artery of native heart without angina pectoris 06/20/2020    Priority: High   Family history of premature CAD 06/20/2020    Priority: High   Family history of colon cancer 12/16/2018    Priority: High   Adenomatous polyp 12/16/2018    Priority: High   Mixed hyperlipidemia 12/16/2018    Priority: High   White coat syndrome with hypertension 12/16/2018    Priority: High   OSA on CPAP 12/16/2018    Priority: High   Obesity (BMI 30-39.9) 12/16/2018    Priority: High   GERD (gastroesophageal reflux disease) 12/16/2018    Priority: Medium    ED (erectile dysfunction) 12/16/2018    Priority: Low   Ganglion cyst of wrist, right 12/07/2021   Current Meds  Medication Sig   amLODipine (NORVASC) 10 MG tablet Take 1 tablet (10 mg total) by mouth daily.   aspirin 81 MG chewable tablet Chew 81 mg by mouth daily.   esomeprazole (NEXIUM) 20 MG capsule Take 1 capsule (20 mg total) by mouth daily.   fluticasone (FLONASE) 50 MCG/ACT  nasal spray Place 2 sprays into both nostrils in the morning and at bedtime.   hydroquinone 4 % cream Apply topically 2 (two) times daily.   losartan-hydrochlorothiazide (HYZAAR) 100-25 MG tablet Take 1 tablet by mouth daily.   tirzepatide (ZEPBOUND) 5 MG/0.5ML Pen Inject 5 mg into the skin once a week.   tretinoin (RETIN-A) 0.05 % cream Apply 1 application topically at bedtime.   [DISCONTINUED] rosuvastatin (CRESTOR) 20 MG tablet TAKE 1 TABLET DAILY   [DISCONTINUED] tadalafil (CIALIS) 20 MG tablet Take 0.5-1 tablets (10-20 mg total) by mouth every other day as needed for erectile dysfunction.    Allergies: Patient is allergic to lisinopril. Family History: Patient family history includes Alcohol abuse in his brother, maternal grandfather, and  paternal grandfather; Arthritis in his father; Colon cancer in his father; Hearing loss in his father; Heart disease in his father; Hyperlipidemia in his brother and mother; Hypertension in his brother and father. Social History:  Patient  reports that he has never smoked. He has never used smokeless tobacco. He reports current alcohol use. He reports that he does not use drugs.  Review of Systems: Constitutional: Negative for fever malaise or anorexia Cardiovascular: negative for chest pain Respiratory: negative for SOB or persistent cough Gastrointestinal: negative for abdominal pain  Objective  Vitals: BP 122/78   Pulse 78   Temp 97.6 F (36.4 C)   Ht 5\' 11"  (1.803 m)   Wt 246 lb 9.6 oz (111.9 kg)   SpO2 96%   BMI 34.39 kg/m  General: no acute distress , A&Ox3, appears well  Commons side effects, risks, benefits, and alternatives for medications and treatment plan prescribed today were discussed, and the patient expressed understanding of the given instructions. Patient is instructed to call or message via MyChart if he/she has any questions or concerns regarding our treatment plan. No barriers to understanding were identified. We discussed Red Flag symptoms and signs in detail. Patient expressed understanding regarding what to do in case of urgent or emergency type symptoms.  Medication list was reconciled, printed and provided to the patient in AVS. Patient instructions and summary information was reviewed with the patient as documented in the AVS. This note was prepared with assistance of Dragon voice recognition software. Occasional wrong-word or sound-a-like substitutions may have occurred due to the inherent limitations of voice recognition software

## 2022-10-17 NOTE — Progress Notes (Signed)
Cardiology Office Note:    Date:  10/19/2022   ID:  Manuel Wagner, DOB 1970-11-23, MRN [id number]  PCP:  Willow Ora, MD   [facility]  Cardiologist:  Meriam Sprague, MD  Advanced Practice Provider:  No care team member to display Electrophysiologist:  None  Referring MD: Willow Ora, MD    History of Present Illness:    Manuel Wagner is a 52 y.o. male with a hx of HTN, HLD, OSA on CPAP, and GERD who returns to clinic for follow-up of chest pain.  Patient was in the ED on 02/10/20 with episode of chest pain radiating to the jaw. Trop negative x2. ECG with NSR with low voltage in the anterior leads. Pain improved with treatment with antiacids.   During visit on 03/28/20, the patient continued to have intermittent atypical chest pain. Given strong family history of CAD with father requiring bypass at age 55, we obtained CTA coronaries which showed 0-25% in mid RCA. 25-49% prox LAD, 25-49% D1. Calcium score 250 which is 96% for age and sex matched controls. We started him on crestor 20mg  and ASA 81mg  at that time.  Seen in 06/13/20 where he was doing well. Underwent endoscopy and was found to have gastritis and esophagitis, which were likely the primary driver of his symptoms. Path negative for malignancy. Symptoms improved with PPI.   Last seen in clinic on 06/2021 where he was doing very well from a CV standpoint. Remained active.  Today, the patient overall feels well. No chest pain, SOB, orthopnea, or LE edema. Remains active without exertional symptoms. Blood pressure is well controlled and at goal. Started zepbound and has started to lose weight. Feels well on the medication with no side effects.    Past Medical History:  Diagnosis Date   Atypical nevus 2009   Back-Moderate (Exc) Derm in [place]   Atypical nevus 2019   Back Midline[place]Mild [place] [facility]   Colon polyp 12/16/2018   Colonoscopy 2013   Essential hypertension 12/16/2018   Family history  of colon cancer 12/16/2018   Family history of premature CAD 06/20/2020   Father had bypass at 68 yo, 3 vessel   GERD (gastroesophageal reflux disease) 12/16/2018   Mixed hyperlipidemia 12/16/2018   OSA on CPAP 12/16/2018   Since 2012   Sleep apnea    cpap    Past Surgical History:  Procedure Laterality Date   WISDOM TOOTH EXTRACTION      Current Medications: Current Meds  Medication Sig   amLODipine (NORVASC) 10 MG tablet Take 1 tablet (10 mg total) by mouth daily.   aspirin 81 MG chewable tablet Chew 81 mg by mouth daily.   esomeprazole (NEXIUM) 20 MG capsule Take 1 capsule (20 mg total) by mouth daily.   fluticasone (FLONASE) 50 MCG/ACT nasal spray Place 2 sprays into both nostrils in the morning and at bedtime.   hydroquinone 4 % cream Apply topically 2 (two) times daily.   losartan-hydrochlorothiazide (HYZAAR) 100-25 MG tablet Take 1 tablet by mouth daily.   rosuvastatin (CRESTOR) 20 MG tablet TAKE 1 TABLET DAILY   tadalafil (CIALIS) 20 MG tablet Take 0.5-1 tablets (10-20 mg total) by mouth every other day as needed for erectile dysfunction.   tirzepatide (ZEPBOUND) 5 MG/0.5ML Pen Inject 5 mg into the skin once a week.   tretinoin (RETIN-A) 0.05 % cream Apply 1 application topically at bedtime.     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[employer], MBA   Occupation: former Merchandiser, retail for [employer]  Tobacco Use   Smoking status: Never   Smokeless tobacco: Never  Vaping Use   Vaping Use: Never used  Substance and Sexual Activity   Alcohol use: Yes    Comment: ocasionally   Drug use: Never   Sexual activity: Yes  Other Topics Concern   Not on file  Social History Narrative   [place]ocated from [place] area 2019; originally from [place],  [place]   Social Determinants of Corporate investment banker Strain: Not on [employer] Insecurity: Not on file  Transportation Needs: Not on file  Physical Activity: Not on file  Stress: Not on file  Social Connections: Not on file     Family History: The patient's family history includes Alcohol abuse in his brother, maternal grandfather, and paternal grandfather; Arthritis in his father; Colon cancer in his father; Hearing loss in his father; Heart disease in his father; Hyperlipidemia in his brother and mother; Hypertension in his brother and father. There is no history of Esophageal cancer, Rectal cancer, Stomach cancer, or Colon polyps.  ROS:   Please see the history of present illness.      EKGs/Labs/Other Studies Reviewed:    The following studies were reviewed today:  Cardiac Studies & Procedures          CT SCANS  CT CORONARY MORPH W/CTA COR W/SCORE 05/29/2020  Addendum 05/29/2020  3:38 PM ADDENDUM REPORT: 05/29/2020 15:36  CLINICAL DATA:  52 year old male with h/o hypertension, hyperlipidemia and chest pain.  EXAM: Cardiac/Coronary  CTA  TECHNIQUE: The patient was scanned on a [employer].  FINDINGS: A 100 kV prospective scan was triggered in the descending thoracic aorta at 111 HU's. Axial non-contrast 3 mm slices were carried out through the heart. The data set was analyzed on a dedicated work station and scored using the Agatson method. Gantry rotation speed was 250 msecs and collimation was .6 mm. 100 mg of PO Metoprolol and 0.8 mg of sl NTG were given. The 3D data set was reconstructed in 5% intervals of the 67-82 % of the R-R cycle. Diastolic phases were analyzed on a dedicated work station using MPR, MIP and VRT modes. The patient received 80 cc of contrast.  Aorta: Normal size. Minimal diffuse atherosclerotic plaque. No dissection.  Aortic Valve:  Trileaflet.  No calcifications.  Coronary Arteries:  Normal coronary origin.   Right dominance.  RCA is a large dominant artery that gives rise to PDA and PLA. There is minimal diffuse calcified plaque in the proximal and mid RCA with stenosis 0-25%. PDA/PLA have no obvious plaque.  Left main is a large artery that gives rise to LAD and LCX arteries. Left main has no plaque.  LAD is a large vessel that wraps around the apex and give rise to one small diagonal artery. There is mild mixed, predominantly calcified plaque in the proximal LAD with maximum stenosis 25-49%. Mid and distal LAD has no obvious plaque.  D1 has mild ostial calcified plaque with stenosis 25-49%.  LCX is a non-dominant artery that gives rise to one large OM1 branch. There is no plaque.  Other findings:  Normal pulmonary vein drainage into the left  atrium.  Normal left atrial appendage without a thrombus.  Normal size of the pulmonary artery.  IMPRESSION: 1. Coronary calcium score of 250. This was 49 percentile for age and sex matched control.  2. Normal coronary origin with right dominance.  3. CAD-RADS 2. Mild non-obstructive CAD (25-49%). Consider non-atherosclerotic causes of chest pain. Consider preventive therapy and risk factor modification.   Electronically Signed By: Tobias Alexander On: 05/29/2020 15:36  Narrative EXAM: OVER-READ INTERPRETATION  CT CHEST  The following report is an over-read performed by radiologist Dr. Trudie Reed of [facility] on 05/26/2020. This over-read does not include interpretation of cardiac or coronary anatomy or pathology. The coronary calcium score/coronary CTA interpretation by the cardiologist is attached.  COMPARISON:  None.  FINDINGS: Within the visualized portions of the thorax there are no suspicious appearing pulmonary nodules or masses, there is no acute consolidative airspace disease, no pleural effusions, no pneumothorax and no lymphadenopathy. Visualized portions of the upper abdomen demonstrates some  coarse calcifications in the medial aspect of the spleen. There are no aggressive appearing lytic or blastic lesions noted in the visualized portions of the skeleton.  IMPRESSION: No significant incidental noncardiac findings.  Electronically Signed: By: Trudie Reed M.D. On: 05/26/2020 08:29            EKG:   NSR-personally reviewed  Recent Labs: 12/07/2021: ALT 21; BUN 11; Creatinine, Ser 0.77; Hemoglobin 16.6; Platelets 268.0; Potassium 4.2; Sodium 138; TSH 1.42   Recent Lipid Panel    Component Value Date/Time   CHOL 109 12/07/2021 1216   TRIG 103.0 12/07/2021 1216   HDL 41.20 12/07/2021 1216   CHOLHDL 3 12/07/2021 1216   VLDL 20.6 12/07/2021 1216   LDLCALC 47 12/07/2021 1216     Physical Exam:    VS:  BP 120/68   Pulse 73   Ht 5\' 11"  (1.803 m)   Wt 246 lb 3.2 oz (111.7 kg)   SpO2 96%   BMI 34.34 kg/m     Wt Readings from Last 3 Encounters:  10/19/22 246 lb 3.2 oz (111.7 kg)  10/05/22 246 lb 9.6 oz (111.9 kg)  07/25/22 261 lb (118.4 kg)     GEN:  Well nourished, well developed in no acute distress HEENT: Normal NECK: No JVD; No carotid bruits CARDIAC: RRR, no murmurs, rubs, gallops RESPIRATORY:  Clear to auscultation without rales, wheezing or rhonchi  ABDOMEN: Soft, non-tender, non-distended MUSCULOSKELETAL:  No edema; No deformity  SKIN: Warm and dry NEUROLOGIC:  Alert and oriented x 3 PSYCHIATRIC:  Normal affect   ASSESSMENT:    1. Coronary artery disease involving native coronary artery of native heart without angina pectoris   2. Essential hypertension   3. Pure hypercholesterolemia   4. OSA on CPAP   5. Obstructive sleep apnea     PLAN:    In order of problems listed above:  #Non-obstructive multivessel CAD: CTA coronaries showed 0-25% in mid RCA. 25-49% prox LAD, 25-49% D1. Calcium score 250 which is 96% for age and sex matched controls. Currently, doing well with no anginal symptoms. -Aggressive risk factor  modification -Continue ASA 81mg  daily -Continue crestor 20mg  daily -Lifestyle modifications with healthy diet and exercise as detailed below  #Non-cardiac Chest Pain #Esophagitis/Gastritis: EGD with esophagitis/gastritis now on PPI with resolution of symptoms. -Continue PPI  -Management per GI and primary  #HTN: Well controlled at home. Managed by PCP. -Continue amlodipine 10mg  daily -Continue Losartan-HCTZ 100-12.5mg  daily   #HLD: -Continue crestor 20mg  daily -Will  repeat labs with PCP (last controlled with LDL 47) -Goal LDL<70   #OSA on CPAP: -Continue nightly CPAP  #Obesity: BMI 34 which has improved on zepbound and lifestyle modifications.   Exercise recommendations: Goal of exercising for at least 30 minutes a day, at least 5 times per week.  Please exercise to a moderate exertion.  This means that while exercising it is difficult to speak in full sentences, however you are not so short of breath that you feel you must stop, and not so comfortable that you can carry on a full conversation.  Exertion level should be approximately a 5/10, if 10 is the most exertion you can perform.  Diet recommendations: Recommend a heart healthy diet such as the Mediterranean diet.  This diet consists of plant based foods, healthy fats, lean meats, olive oil.  It suggests limiting the intake of simple carbohydrates such as white breads, pastries, and pastas.  It also limits the amount of red meat, wine, and dairy products such as cheese that one should consume on a daily basis.    Follow-up:   1 year.  Medication Adjustments/Labs and Tests Ordered: Current medicines are reviewed at length with the patient today.  Concerns regarding medicines are outlined above.   Orders Placed This Encounter  Procedures   EKG 12-Lead   No orders of the defined types were placed in this encounter.  There are no Patient Instructions on file for this visit.  Signed, Meriam Sprague, MD  10/19/2022  9:57 AM    [facility]

## 2022-10-19 ENCOUNTER — Encounter: Payer: Self-pay | Admitting: Cardiology

## 2022-10-19 ENCOUNTER — Ambulatory Visit: Payer: Managed Care, Other (non HMO) | Attending: Cardiology | Admitting: Cardiology

## 2022-10-19 VITALS — BP 120/68 | HR 73 | Ht 71.0 in | Wt 246.2 lb

## 2022-10-19 DIAGNOSIS — E7800 Pure hypercholesterolemia, unspecified: Secondary | ICD-10-CM | POA: Diagnosis not present

## 2022-10-19 DIAGNOSIS — I2510 Atherosclerotic heart disease of native coronary artery without angina pectoris: Secondary | ICD-10-CM | POA: Diagnosis not present

## 2022-10-19 DIAGNOSIS — G4733 Obstructive sleep apnea (adult) (pediatric): Secondary | ICD-10-CM | POA: Diagnosis not present

## 2022-10-19 DIAGNOSIS — I10 Essential (primary) hypertension: Secondary | ICD-10-CM

## 2022-10-19 NOTE — Patient Instructions (Signed)
Medication Instructions:  Your physician recommends that you continue on your current medications as directed. Please refer to the Current Medication list given to you today.  *If you need a refill on your cardiac medications before your next appointment, please call your pharmacy*   Follow-Up: At [facility], you and your health needs are our priority.  As part of our continuing mission to provide you with exceptional heart care, we have created designated Provider Care Teams.  These Care Teams include your primary Cardiologist (physician) and Advanced Practice Providers (APPs -  Physician Assistants and Nurse Practitioners) who all work together to provide you with the care you need, when you need it.   Your next appointment:   1 year(s)  Provider:   Dr Acharya    

## 2022-10-30 ENCOUNTER — Other Ambulatory Visit (HOSPITAL_COMMUNITY): Payer: Self-pay

## 2022-11-02 ENCOUNTER — Other Ambulatory Visit (HOSPITAL_COMMUNITY): Payer: Self-pay

## 2022-11-05 ENCOUNTER — Other Ambulatory Visit (HOSPITAL_COMMUNITY): Payer: Self-pay

## 2022-11-20 ENCOUNTER — Encounter: Payer: Self-pay | Admitting: Family Medicine

## 2022-11-27 ENCOUNTER — Other Ambulatory Visit (HOSPITAL_COMMUNITY): Payer: Self-pay

## 2022-11-27 MED ORDER — ZEPBOUND 7.5 MG/0.5ML ~~LOC~~ SOAJ
7.5000 mg | SUBCUTANEOUS | 0 refills | Status: DC
  Filled 2022-11-27: qty 2, 28d supply, fill #0
  Filled 2022-12-21: qty 2, 28d supply, fill #1
  Filled 2023-01-18: qty 2, 28d supply, fill #2

## 2022-11-27 MED ORDER — AMOXICILLIN 500 MG PO CAPS
500.0000 mg | ORAL_CAPSULE | Freq: Three times a day (TID) | ORAL | 0 refills | Status: DC
  Filled 2022-11-27: qty 21, 7d supply, fill #0

## 2022-11-27 NOTE — Addendum Note (Signed)
Addended by: Asencion Partridge on: 11/27/2022 01:08 PM   Modules accepted: Orders

## 2022-11-28 ENCOUNTER — Other Ambulatory Visit (HOSPITAL_COMMUNITY): Payer: Self-pay

## 2022-11-29 ENCOUNTER — Other Ambulatory Visit (HOSPITAL_COMMUNITY): Payer: Self-pay

## 2022-12-21 ENCOUNTER — Other Ambulatory Visit (HOSPITAL_COMMUNITY): Payer: Self-pay

## 2022-12-21 ENCOUNTER — Other Ambulatory Visit: Payer: Self-pay

## 2023-01-15 ENCOUNTER — Other Ambulatory Visit (HOSPITAL_COMMUNITY): Payer: Self-pay

## 2023-01-15 MED ORDER — IMIQUIMOD 5 % EX CREA
1.0000 | TOPICAL_CREAM | Freq: Every day | CUTANEOUS | 0 refills | Status: DC
  Filled 2023-01-15: qty 12, 24d supply, fill #0

## 2023-01-24 ENCOUNTER — Other Ambulatory Visit (HOSPITAL_COMMUNITY): Payer: Self-pay

## 2023-01-24 MED ORDER — COVID-19 MRNA VAC-TRIS(PFIZER) 30 MCG/0.3ML IM SUSY
0.3000 mL | PREFILLED_SYRINGE | Freq: Once | INTRAMUSCULAR | 0 refills | Status: AC
  Filled 2023-01-24: qty 0.3, 1d supply, fill #0

## 2023-01-24 MED ORDER — INFLUENZA VIRUS VACC SPLIT PF (FLUZONE) 0.5 ML IM SUSY
0.5000 mL | PREFILLED_SYRINGE | Freq: Once | INTRAMUSCULAR | 0 refills | Status: AC
  Filled 2023-01-24: qty 0.5, 1d supply, fill #0

## 2023-01-29 ENCOUNTER — Ambulatory Visit: Payer: Managed Care, Other (non HMO)

## 2023-02-12 ENCOUNTER — Encounter: Payer: Self-pay | Admitting: Family Medicine

## 2023-02-12 ENCOUNTER — Other Ambulatory Visit (HOSPITAL_COMMUNITY): Payer: Self-pay

## 2023-02-12 ENCOUNTER — Other Ambulatory Visit: Payer: Self-pay | Admitting: Family Medicine

## 2023-02-12 MED ORDER — ZEPBOUND 7.5 MG/0.5ML ~~LOC~~ SOAJ
7.5000 mg | SUBCUTANEOUS | 0 refills | Status: DC
  Filled 2023-02-12: qty 6, 84d supply, fill #0

## 2023-02-18 ENCOUNTER — Ambulatory Visit: Payer: Managed Care, Other (non HMO) | Admitting: Family Medicine

## 2023-02-18 ENCOUNTER — Encounter: Payer: Self-pay | Admitting: Family Medicine

## 2023-02-18 ENCOUNTER — Other Ambulatory Visit (HOSPITAL_COMMUNITY): Payer: Self-pay

## 2023-02-18 VITALS — BP 129/79 | HR 83 | Temp 98.1°F | Ht 71.0 in | Wt 236.2 lb

## 2023-02-18 DIAGNOSIS — I2510 Atherosclerotic heart disease of native coronary artery without angina pectoris: Secondary | ICD-10-CM

## 2023-02-18 DIAGNOSIS — I10 Essential (primary) hypertension: Secondary | ICD-10-CM

## 2023-02-18 DIAGNOSIS — E669 Obesity, unspecified: Secondary | ICD-10-CM | POA: Diagnosis not present

## 2023-02-18 DIAGNOSIS — E782 Mixed hyperlipidemia: Secondary | ICD-10-CM | POA: Diagnosis not present

## 2023-02-18 DIAGNOSIS — R21 Rash and other nonspecific skin eruption: Secondary | ICD-10-CM | POA: Diagnosis not present

## 2023-02-18 LAB — COMPREHENSIVE METABOLIC PANEL
ALT: 24 U/L (ref 0–53)
AST: 24 U/L (ref 0–37)
Albumin: 4.7 g/dL (ref 3.5–5.2)
Alkaline Phosphatase: 59 U/L (ref 39–117)
BUN: 12 mg/dL (ref 6–23)
CO2: 26 meq/L (ref 19–32)
Calcium: 10 mg/dL (ref 8.4–10.5)
Chloride: 102 meq/L (ref 96–112)
Creatinine, Ser: 0.82 mg/dL (ref 0.40–1.50)
GFR: 101.32 mL/min (ref >60.00)
Glucose, Bld: 96 mg/dL (ref 70–99)
Potassium: 4.3 meq/L (ref 3.5–5.1)
Sodium: 137 meq/L (ref 135–145)
Total Bilirubin: 1.1 mg/dL (ref 0.2–1.2)
Total Protein: 7.8 g/dL (ref 6.0–8.3)

## 2023-02-18 LAB — CBC WITH DIFFERENTIAL/PLATELET
Basophils Absolute: 0.1 10*3/uL (ref 0.0–0.1)
Basophils Relative: 0.7 % (ref 0.0–3.0)
Eosinophils Absolute: 0.1 10*3/uL (ref 0.0–0.7)
Eosinophils Relative: 0.6 % (ref 0.0–5.0)
HCT: 47.7 % (ref 39.0–52.0)
Hemoglobin: 16.3 g/dL (ref 13.0–17.0)
Lymphocytes Relative: 23 % (ref 12.0–46.0)
Lymphs Abs: 2.2 10*3/uL (ref 0.7–4.0)
MCHC: 34.1 g/dL (ref 30.0–36.0)
MCV: 84.7 fL (ref 78.0–100.0)
Monocytes Absolute: 0.5 10*3/uL (ref 0.1–1.0)
Monocytes Relative: 4.9 % (ref 3.0–12.0)
Neutro Abs: 6.8 10*3/uL (ref 1.4–7.7)
Neutrophils Relative %: 70.8 % (ref 43.0–77.0)
Platelets: 318 10*3/uL (ref 150.0–400.0)
RBC: 5.64 Mil/uL (ref 4.22–5.81)
RDW: 14.3 % (ref 11.5–15.5)
WBC: 9.6 10*3/uL (ref 4.0–10.5)

## 2023-02-18 LAB — LIPID PANEL
Cholesterol: 107 mg/dL (ref 0–200)
HDL: 38.1 mg/dL — ABNORMAL LOW (ref >39.00)
LDL Cholesterol: 48 mg/dL (ref 0–99)
NonHDL: 69.25
Total CHOL/HDL Ratio: 3
Triglycerides: 105 mg/dL (ref 0.0–149.0)
VLDL: 21 mg/dL (ref 0.0–40.0)

## 2023-02-18 MED ORDER — TRIAMCINOLONE ACETONIDE 0.1 % EX CREA: 1.0000 | TOPICAL_CREAM | Freq: Two times a day (BID) | CUTANEOUS | 0 refills | Status: DC

## 2023-02-18 MED ORDER — TRIAMCINOLONE ACETONIDE 0.1 % EX CREA
1.0000 | TOPICAL_CREAM | Freq: Two times a day (BID) | CUTANEOUS | 0 refills | Status: DC
  Filled 2023-02-18: qty 30, 15d supply, fill #0

## 2023-02-18 NOTE — Progress Notes (Signed)
Subjective  CC:  Chief Complaint  Patient presents with   blisters on arm    Pt stated that he noticed some blisters on both are on Wednesday. They are not painful     HPI: Manuel Wagner is a 52 y.o. male who presents to the office today to address the problems listed above in the chief complaint. Rash: Started about 4 to 5 days ago on bilateral upper extremities.  Not spreading.  Mildly itchy at first but not now.  Not painful.  Not spreading.  No systemic symptoms.  No clear triggers or allergens.  Feels fine.  Rash is drying up. Hypertension f/u: Control is good . Pt reports he is doing well. taking medications as instructed, no medication side effects noted, no TIAs, no chest pain on exertion, no dyspnea on exertion, no swelling of ankles.  Has whitecoat syndrome.  Blood pressure looks pretty good here in the office today.  He denies adverse effects from his BP medications. Compliance with medication is good.  Hyperlipidemia: Takes Crestor 20 mg nightly.  Nonfasting today for recheck.  Tolerates well. Slow weight loss with Zepbound: Now on 7.5 mg weekly.  No adverse effects.  Feels like it is working fairly well. CAD, nonobstructive: No chest pain, on aspirin.  Reviewed cardiology notes  Assessment  1. Rash   2. White coat syndrome with hypertension   3. Coronary artery disease involving native coronary artery of native heart without angina pectoris   4. Mixed hyperlipidemia   5. Obesity (BMI 30-39.9)      Plan   Hypertension f/u: BP control is well controlled.  Continue home monitoring.  Continue Hyzaar and amlodipine.  Check renal function and electrolytes Hyperlipidemia on Crestor 20: Recheck nonfasting lipids and LFTs today Obesity: Continue Zepbound 7.5 mg weekly.  If weight plateaus, increase to 10 mg weekly Rash: Unclear etiology, possibly allergic: Triamcinolone cream twice daily.  It is improving.  Differential diagnosis includes infectious etiologies as well.  He will  let me know if it does not resolve  Education regarding management of these chronic disease states was given. Management strategies discussed on successive visits include dietary and exercise recommendations, goals of achieving and maintaining IBW, and lifestyle modifications aiming for adequate sleep and minimizing stressors.   Follow up: 3 months for complete physical  Orders Placed This Encounter  Procedures   Lipid panel   Comprehensive metabolic panel   CBC with Differential/Platelet   Meds ordered this encounter  Medications   triamcinolone cream (KENALOG) 0.1 %    Sig: Apply 1 Application topically 2 (two) times daily. For 2 weeks, then as needed    Dispense:  30 g    Refill:  0      BP Readings from Last 3 Encounters:  02/18/23 129/79  10/19/22 120/68  10/05/22 122/78   Wt Readings from Last 3 Encounters:  02/18/23 236 lb 3.2 oz (107.1 kg)  10/19/22 246 lb 3.2 oz (111.7 kg)  10/05/22 246 lb 9.6 oz (111.9 kg)    Lab Results  Component Value Date   CHOL 109 12/07/2021   CHOL 102 06/20/2020   CHOL 145 06/16/2019   Lab Results  Component Value Date   HDL 41.20 12/07/2021   HDL 35.90 (L) 06/20/2020   HDL 32.90 (L) 06/16/2019   Lab Results  Component Value Date   LDLCALC 47 12/07/2021   LDLCALC 47 06/20/2020   LDLCALC 89 06/16/2019   Lab Results  Component Value Date   TRIG  103.0 12/07/2021   TRIG 96.0 06/20/2020   TRIG 116.0 06/16/2019   Lab Results  Component Value Date   CHOLHDL 3 12/07/2021   CHOLHDL 3 06/20/2020   CHOLHDL 4 06/16/2019   No results found for: "LDLDIRECT" Lab Results  Component Value Date   CREATININE 0.77 12/07/2021   BUN 11 12/07/2021   NA 138 12/07/2021   K 4.2 12/07/2021   CL 101 12/07/2021   CO2 25 12/07/2021    The ASCVD Risk score (Arnett DK, et al., 2019) failed to calculate for the following reasons:   The valid total cholesterol range is 130 to 320 mg/dL  I reviewed the patients updated PMH, FH, and SocHx.     Patient Active Problem List   Diagnosis Date Noted   Coronary artery disease involving native coronary artery of native heart without angina pectoris 06/20/2020    Priority: High   Family history of premature CAD 06/20/2020    Priority: High   Family history of colon cancer 12/16/2018    Priority: High   Adenomatous polyp 12/16/2018    Priority: High   Mixed hyperlipidemia 12/16/2018    Priority: High   White coat syndrome with hypertension 12/16/2018    Priority: High   OSA on CPAP 12/16/2018    Priority: High   Obesity (BMI 30-39.9) 12/16/2018    Priority: High   GERD (gastroesophageal reflux disease) 12/16/2018    Priority: Medium    ED (erectile dysfunction) 12/16/2018    Priority: Low   Ganglion cyst of wrist, right 12/07/2021    Allergies: Lisinopril  Social History: Patient  reports that he has never smoked. He has never used smokeless tobacco. He reports current alcohol use. He reports that he does not use drugs.  Current Meds  Medication Sig   amLODipine (NORVASC) 10 MG tablet Take 1 tablet (10 mg total) by mouth daily.   aspirin 81 MG chewable tablet Chew 81 mg by mouth daily.   esomeprazole (NEXIUM) 20 MG capsule Take 1 capsule (20 mg total) by mouth daily.   fluticasone (FLONASE) 50 MCG/ACT nasal spray Place 2 sprays into both nostrils in the morning and at bedtime.   hydroquinone 4 % cream Apply topically 2 (two) times daily.   imiquimod (ALDARA) 5 % cream Apply a small amount to affected area once a day and cover with a bandage x 1 month   losartan-hydrochlorothiazide (HYZAAR) 100-25 MG tablet Take 1 tablet by mouth daily.   rosuvastatin (CRESTOR) 20 MG tablet TAKE 1 TABLET DAILY   tadalafil (CIALIS) 20 MG tablet Take 0.5-1 tablets (10-20 mg total) by mouth every other day as needed for erectile dysfunction.   tirzepatide (ZEPBOUND) 7.5 MG/0.5ML Pen Inject 7.5 mg into the skin once a week.   tretinoin (RETIN-A) 0.05 % cream Apply 1 application topically at  bedtime.   triamcinolone cream (KENALOG) 0.1 % Apply 1 Application topically 2 (two) times daily. For 2 weeks, then as needed    Review of Systems: Cardiovascular: negative for chest pain, palpitations, leg swelling, orthopnea Respiratory: negative for SOB, wheezing or persistent cough Gastrointestinal: negative for abdominal pain Genitourinary: negative for dysuria or gross hematuria  Objective  Vitals: BP 129/79   Pulse 83   Temp 98.1 F (36.7 C)   Ht 5\' 11"  (1.803 m)   Wt 236 lb 3.2 oz (107.1 kg)   SpO2 98%   BMI 32.94 kg/m  General: no acute distress  Psych:  Alert and oriented, normal mood and affect HEENT:  Normocephalic, atraumatic, supple neck  Cardiovascular:  RRR without murmur. no edema Respiratory:  Good breath sounds bilaterally, CTAB with normal respiratory effort Skin: Bilateral upper arms with scabbed papules, nonlinear, without clear vesicles.  Some with central indentation.  No ulcerations.  No erythema or warmth.  No drainage. Commons side effects, risks, benefits, and alternatives for medications and treatment plan prescribed today were discussed, and the patient expressed understanding of the given instructions. Patient is instructed to call or message via MyChart if he/she has any questions or concerns regarding our treatment plan. No barriers to understanding were identified. We discussed Red Flag symptoms and signs in detail. Patient expressed understanding regarding what to do in case of urgent or emergency type symptoms.  Medication list was reconciled, printed and provided to the patient in AVS. Patient instructions and summary information was reviewed with the patient as documented in the AVS. This note was prepared with assistance of Dragon voice recognition software. Occasional wrong-word or sound-a-like substitutions may have occurred due to the inherent limitation

## 2023-02-18 NOTE — Patient Instructions (Signed)
Please return in 3 months for your annual complete physical; please come fasting.   I will release your lab results to you on your MyChart account with further instructions. You may see the results before I do, but when I review them I will send you a message with my report or have my assistant call you if things need to be discussed. Please reply to my message with any questions. Thank you!   If you have any questions or concerns, please don't hesitate to send me a message via MyChart or call the office at [phone]. Thank you for visiting with us today! It's our pleasure caring for you.  

## 2023-02-20 ENCOUNTER — Other Ambulatory Visit (HOSPITAL_COMMUNITY): Payer: Self-pay

## 2023-02-21 NOTE — Progress Notes (Signed)
Labs reviewed.  The ASCVD Risk score (Arnett DK, et al., 2019) failed to calculate for the following reasons:   The valid total cholesterol range is 130 to 320 mg/dL  Dear Mr. Bell, Thank you for allowing me to care for you at your recent office visit.  I wanted to let you know that I have reviewed your lab test results and am happy to report that they are all normal.  Things look good  Sincerely, Dr. Mardelle Matte

## 2023-03-12 ENCOUNTER — Other Ambulatory Visit (HOSPITAL_COMMUNITY): Payer: Self-pay

## 2023-03-15 ENCOUNTER — Other Ambulatory Visit (HOSPITAL_COMMUNITY): Payer: Self-pay

## 2023-03-15 MED ORDER — AMOXICILLIN 500 MG PO CAPS
500.0000 mg | ORAL_CAPSULE | Freq: Three times a day (TID) | ORAL | 0 refills | Status: DC
  Filled 2023-03-15: qty 21, 7d supply, fill #0

## 2023-04-28 ENCOUNTER — Other Ambulatory Visit: Payer: Self-pay | Admitting: Family Medicine

## 2023-04-29 ENCOUNTER — Other Ambulatory Visit (HOSPITAL_COMMUNITY): Payer: Self-pay

## 2023-04-29 MED ORDER — ZEPBOUND 7.5 MG/0.5ML ~~LOC~~ SOAJ
7.5000 mg | SUBCUTANEOUS | 0 refills | Status: DC
Start: 2023-04-29 — End: 2023-06-14
  Filled 2023-04-29 – 2023-05-02 (×3): qty 6, 84d supply, fill #0
  Filled 2023-05-08: qty 2, 28d supply, fill #0
  Filled 2023-05-17: qty 2, 28d supply, fill #1
  Filled 2023-06-11 – 2023-06-12 (×3): qty 2, 28d supply, fill #2

## 2023-05-01 ENCOUNTER — Other Ambulatory Visit (HOSPITAL_COMMUNITY): Payer: Self-pay

## 2023-05-02 ENCOUNTER — Other Ambulatory Visit (HOSPITAL_COMMUNITY): Payer: Self-pay

## 2023-05-03 ENCOUNTER — Other Ambulatory Visit (HOSPITAL_COMMUNITY): Payer: Self-pay

## 2023-05-07 ENCOUNTER — Telehealth: Payer: Self-pay

## 2023-05-07 ENCOUNTER — Other Ambulatory Visit (HOSPITAL_COMMUNITY): Payer: Self-pay

## 2023-05-07 NOTE — Telephone Encounter (Signed)
*  Primary  Pharmacy Patient Advocate Encounter   Received notification from Fax that prior authorization for Zepbound 7.5MG /0.5ML pen-injectors  is required/requested.   Insurance verification completed.   The patient is insured through [employer] .   Per test claim: PA required; PA started via CoverMyMeds. KEY [id number] . Please see clinical question(s) below that I am not finding the answer to in her chart and advise.   PA question:   Is your patient able to tolerate a Zepbound maintenance dose of 5 mg, 10 mg, or 15 mg once weekly?*

## 2023-05-07 NOTE — Telephone Encounter (Signed)
Please see note from Rx PA team and advise on pt tolerance of Zepbound

## 2023-05-08 ENCOUNTER — Other Ambulatory Visit (HOSPITAL_COMMUNITY): Payer: Self-pay

## 2023-05-08 NOTE — Telephone Encounter (Signed)
 A user error has taken place: encounter opened in error, closed for administrative reasons.

## 2023-05-09 NOTE — Telephone Encounter (Signed)
Can patient be increased to a typical maintenance dose of 10mg ?

## 2023-05-10 NOTE — Telephone Encounter (Signed)
Closing request as patient will be increased to 10mg .

## 2023-05-17 ENCOUNTER — Other Ambulatory Visit (HOSPITAL_COMMUNITY): Payer: Self-pay

## 2023-05-17 ENCOUNTER — Other Ambulatory Visit: Payer: Self-pay

## 2023-06-10 ENCOUNTER — Encounter: Payer: Self-pay | Admitting: Family Medicine

## 2023-06-12 ENCOUNTER — Other Ambulatory Visit (HOSPITAL_COMMUNITY): Payer: Self-pay

## 2023-06-12 ENCOUNTER — Encounter (HOSPITAL_COMMUNITY): Payer: Self-pay

## 2023-06-12 ENCOUNTER — Telehealth: Payer: Self-pay

## 2023-06-12 NOTE — Telephone Encounter (Signed)
 Copied from CRM [id number]. Topic: Clinical - Medication Question >> Jun 12, 2023  9:19 AM Sim Boast F wrote: Reason for CRM: Patient request a call back regarding Zepbound, says he would like an increased dosage  Please Advise.  Message has been sent to provider to address

## 2023-06-14 ENCOUNTER — Other Ambulatory Visit (HOSPITAL_COMMUNITY): Payer: Self-pay

## 2023-06-14 ENCOUNTER — Telehealth: Payer: Self-pay

## 2023-06-14 ENCOUNTER — Encounter (HOSPITAL_COMMUNITY): Payer: Self-pay

## 2023-06-14 MED ORDER — ZEPBOUND 10 MG/0.5ML ~~LOC~~ SOAJ
10.0000 mg | SUBCUTANEOUS | 5 refills | Status: DC
Start: 2023-06-14 — End: 2023-11-04
  Filled 2023-06-14 – 2023-06-19 (×5): qty 2, 28d supply, fill #0
  Filled 2023-07-12: qty 2, 28d supply, fill #1
  Filled 2023-08-09: qty 2, 28d supply, fill #2
  Filled 2023-09-05 – 2023-09-23 (×2): qty 2, 28d supply, fill #3
  Filled 2023-10-05 – 2023-10-17 (×2): qty 2, 28d supply, fill #4
  Filled 2023-11-04 – 2023-11-11 (×2): qty 2, 28d supply, fill #5

## 2023-06-14 NOTE — Telephone Encounter (Signed)
 RX ordered. Sent message to pt. From his telephone note. See there.

## 2023-06-14 NOTE — Telephone Encounter (Signed)
 Pharmacy Patient Advocate Encounter   Received notification from CoverMyMeds that prior authorization for Zepbound 7.5MG /0.5ML pen-injectors is required/requested.   Insurance verification completed.   The patient is insured through [employer] .   Per test claim: PA required; PA submitted to above mentioned insurance via CoverMyMeds Key/confirmation #/EOC [id number] Status is pending

## 2023-06-14 NOTE — Addendum Note (Signed)
 Addended by: Asencion Partridge on: 06/14/2023 05:49 AM   Modules accepted: Orders

## 2023-06-17 ENCOUNTER — Other Ambulatory Visit (HOSPITAL_COMMUNITY): Payer: Self-pay

## 2023-06-19 ENCOUNTER — Ambulatory Visit: Admitting: Family Medicine

## 2023-06-19 ENCOUNTER — Other Ambulatory Visit (HOSPITAL_COMMUNITY): Payer: Self-pay

## 2023-06-19 ENCOUNTER — Encounter: Payer: Self-pay | Admitting: Family Medicine

## 2023-06-19 VITALS — BP 120/78 | HR 70 | Temp 97.8°F | Ht 71.0 in | Wt 232.0 lb

## 2023-06-19 DIAGNOSIS — E669 Obesity, unspecified: Secondary | ICD-10-CM | POA: Diagnosis not present

## 2023-06-19 DIAGNOSIS — H6991 Unspecified Eustachian tube disorder, right ear: Secondary | ICD-10-CM | POA: Diagnosis not present

## 2023-06-19 DIAGNOSIS — J069 Acute upper respiratory infection, unspecified: Secondary | ICD-10-CM | POA: Diagnosis not present

## 2023-06-19 DIAGNOSIS — H9311 Tinnitus, right ear: Secondary | ICD-10-CM

## 2023-06-19 DIAGNOSIS — I10 Essential (primary) hypertension: Secondary | ICD-10-CM | POA: Diagnosis not present

## 2023-06-19 NOTE — Patient Instructions (Signed)
Please return in November for your annual complete physical; please come fasting.  ? ?If you have any questions or concerns, please don't hesitate to send me a message via MyChart or call the office at [phone]. Thank you for visiting with us today! It's our pleasure caring for you.  ?

## 2023-06-19 NOTE — Progress Notes (Signed)
 Subjective  CC:  Chief Complaint  Patient presents with   Tinnitus    Rt ear that is ringing    Sinus Problem    Sinus problems started about 2 weeks ago    HPI: Manuel Wagner is a 53 y.o. male who presents to the office today to address the problems listed above in the chief complaint. Hypertension f/u: Control is good . Pt reports he is doing well. taking medications as instructed, no medication side effects noted, no TIAs, no chest pain on exertion, no dyspnea on exertion, no swelling of ankles. He denies adverse effects from his BP medications. Compliance with medication is good.  Obesity: doing well with weight loss medication, zepbound, increasing dose to 10. Tolerates well. No AE Had uri sxs about 10 days ago: sinus congestion and pressure with ear fullness and PND and mild cough.  No fevers, chills, shortness of breath, sinus pain or dental pain.  Mostly all resolved but noted over the weekend that his right ear felt full and he could not hear well out of it.  It felt blocked.  Also noted some mild ringing.  Occasional mild ringing present.  No hearing loss now.  Feels mostly completely recovered.  Wants to make sure nothing else is going on.  No lingering cough  Assessment  1. White coat syndrome with hypertension   2. Obesity (BMI 30-39.9)   3. Viral upper respiratory tract infection   4. Dysfunction of right eustachian tube   5. Right-sided tinnitus      Plan   Hypertension f/u: BP control is well controlled.  Continue current medications Obesity: Weight to trend downward.  Healthy lifestyle.  Continue Zepbound Recent viral upper respiratory tract infection resolving.  Add Zyrtec and Flonase for persistent nasal congestion and PND.  Will also help eustachian tube dysfunction.  Reassured.  No current infection.  Education regarding management of these chronic disease states was given. Management strategies discussed on successive visits include dietary and exercise  recommendations, goals of achieving and maintaining IBW, and lifestyle modifications aiming for adequate sleep and minimizing stressors.   Follow up: November for complete physical and follow-up  No orders of the defined types were placed in this encounter.  No orders of the defined types were placed in this encounter.     BP Readings from Last 3 Encounters:  06/19/23 120/78  02/18/23 129/79  10/19/22 120/68   Wt Readings from Last 3 Encounters:  06/19/23 232 lb (105.2 kg)  02/18/23 236 lb 3.2 oz (107.1 kg)  10/19/22 246 lb 3.2 oz (111.7 kg)    Lab Results  Component Value Date   CHOL 107 02/18/2023   CHOL 109 12/07/2021   CHOL 102 06/20/2020   Lab Results  Component Value Date   HDL 38.10 (L) 02/18/2023   HDL 41.20 12/07/2021   HDL 35.90 (L) 06/20/2020   Lab Results  Component Value Date   LDLCALC 48 02/18/2023   LDLCALC 47 12/07/2021   LDLCALC 47 06/20/2020   Lab Results  Component Value Date   TRIG 105.0 02/18/2023   TRIG 103.0 12/07/2021   TRIG 96.0 06/20/2020   Lab Results  Component Value Date   CHOLHDL 3 02/18/2023   CHOLHDL 3 12/07/2021   CHOLHDL 3 06/20/2020   No results found for: "LDLDIRECT" Lab Results  Component Value Date   CREATININE 0.82 02/18/2023   BUN 12 02/18/2023   NA 137 02/18/2023   K 4.3 02/18/2023   CL 102 02/18/2023  CO2 26 02/18/2023    The ASCVD Risk score (Arnett DK, et al., 2019) failed to calculate for the following reasons:   The valid total cholesterol range is 130 to 320 mg/dL  I reviewed the patients updated PMH, FH, and SocHx.    Patient Active Problem List   Diagnosis Date Noted   Coronary artery disease involving native coronary artery of native heart without angina pectoris 06/20/2020    Priority: High   Family history of premature CAD 06/20/2020    Priority: High   Family history of colon cancer 12/16/2018    Priority: High   Adenomatous polyp 12/16/2018    Priority: High   Mixed hyperlipidemia  12/16/2018    Priority: High   White coat syndrome with hypertension 12/16/2018    Priority: High   OSA on CPAP 12/16/2018    Priority: High   Obesity (BMI 30-39.9) 12/16/2018    Priority: High   GERD (gastroesophageal reflux disease) 12/16/2018    Priority: Medium    Ganglion cyst of wrist, right 12/07/2021    Priority: Low   ED (erectile dysfunction) 12/16/2018    Priority: Low    Allergies: Lisinopril  Social History: Patient  reports that he has never smoked. He has never used smokeless tobacco. He reports current alcohol use. He reports that he does not use drugs.  Current Meds  Medication Sig   amLODipine (NORVASC) 10 MG tablet Take 1 tablet (10 mg total) by mouth daily.   aspirin 81 MG chewable tablet Chew 81 mg by mouth daily.   esomeprazole (NEXIUM) 20 MG capsule Take 1 capsule (20 mg total) by mouth daily.   imiquimod (ALDARA) 5 % cream Apply a small amount to affected area once a day and cover with a bandage x 1 month   losartan-hydrochlorothiazide (HYZAAR) 100-25 MG tablet Take 1 tablet by mouth daily.   rosuvastatin (CRESTOR) 20 MG tablet TAKE 1 TABLET DAILY   tadalafil (CIALIS) 20 MG tablet Take 0.5-1 tablets (10-20 mg total) by mouth every other day as needed for erectile dysfunction.   tirzepatide (ZEPBOUND) 10 MG/0.5ML Pen Inject 10 mg into the skin once a week.   tretinoin (RETIN-A) 0.05 % cream Apply 1 application topically at bedtime.   triamcinolone cream (KENALOG) 0.1 % Apply 1 Application topically 2 (two) times daily. For 2 weeks, then as needed   [DISCONTINUED] triamcinolone cream (KENALOG) 0.1 % Apply 1 Application topically 2 (two) times daily to the affected area for 2 weeks, then as needed.    Review of Systems: Cardiovascular: negative for chest pain, palpitations, leg swelling, orthopnea Respiratory: negative for SOB, wheezing or persistent cough Gastrointestinal: negative for abdominal pain Genitourinary: negative for dysuria or gross  hematuria  Objective  Vitals: BP 120/78   Pulse 70   Temp 97.8 F (36.6 C)   Ht 5\' 11"  (1.803 m)   Wt 232 lb (105.2 kg)   SpO2 99%   BMI 32.36 kg/m  General: no acute distress  Psych:  Alert and oriented, normal mood and affect HEENT:  Normocephalic, atraumatic, supple neck, oropharynx clear, right TM with serous effusion, normal landmarks, normal left TM.  No lymphadenopathy Cardiovascular:  RRR without murmur. no edema Respiratory:  Good breath sounds bilaterally, CTAB with normal respiratory effort Skin:  Warm, no rashes Neurologic:   Mental status is normal Commons side effects, risks, benefits, and alternatives for medications and treatment plan prescribed today were discussed, and the patient expressed understanding of the given instructions. Patient is instructed  to call or message via MyChart if he/she has any questions or concerns regarding our treatment plan. No barriers to understanding were identified. We discussed Red Flag symptoms and signs in detail. Patient expressed understanding regarding what to do in case of urgent or emergency type symptoms.  Medication list was reconciled, printed and provided to the patient in AVS. Patient instructions and summary information was reviewed with the patient as documented in the AVS. This note was prepared with assistance of Dragon voice recognition software. Occasional wrong-word or sound-a-like substitutions may have occurred due to the inherent limitation

## 2023-06-27 ENCOUNTER — Other Ambulatory Visit: Payer: Self-pay | Admitting: Family Medicine

## 2023-07-03 NOTE — Telephone Encounter (Signed)
 Pharmacy Patient Advocate Encounter  Received notification from [employer] that Prior Authorization for Zepbound 7.5MG /0.5ML pen-injectors has been APPROVED from 06/14/2023 to 02/14/2024   PA #/Case ID/Reference #: [id number]

## 2023-07-16 ENCOUNTER — Other Ambulatory Visit (HOSPITAL_COMMUNITY): Payer: Self-pay

## 2023-07-16 MED ORDER — CHLORHEXIDINE GLUCONATE 0.12 % MT SOLN
Freq: Two times a day (BID) | OROMUCOSAL | 12 refills | Status: DC
  Filled 2023-07-16: qty 473, 30d supply, fill #0

## 2023-07-17 ENCOUNTER — Other Ambulatory Visit (HOSPITAL_COMMUNITY): Payer: Self-pay

## 2023-07-29 ENCOUNTER — Other Ambulatory Visit (HOSPITAL_COMMUNITY): Payer: Self-pay

## 2023-08-22 DIAGNOSIS — H9042 Sensorineural hearing loss, unilateral, left ear, with unrestricted hearing on the contralateral side: Secondary | ICD-10-CM | POA: Insufficient documentation

## 2023-08-22 DIAGNOSIS — H9312 Tinnitus, left ear: Secondary | ICD-10-CM | POA: Insufficient documentation

## 2023-09-05 ENCOUNTER — Encounter: Payer: Self-pay | Admitting: Internal Medicine

## 2023-09-17 ENCOUNTER — Other Ambulatory Visit (HOSPITAL_COMMUNITY): Payer: Self-pay

## 2023-09-23 ENCOUNTER — Other Ambulatory Visit (HOSPITAL_COMMUNITY): Payer: Self-pay

## 2023-09-25 ENCOUNTER — Other Ambulatory Visit: Payer: Self-pay | Admitting: Family Medicine

## 2023-10-07 ENCOUNTER — Other Ambulatory Visit (HOSPITAL_COMMUNITY): Payer: Self-pay

## 2023-10-22 ENCOUNTER — Ambulatory Visit: Admitting: Family

## 2023-10-22 VITALS — BP 110/70 | HR 76 | Temp 97.5°F | Ht 71.0 in | Wt 235.4 lb

## 2023-10-22 DIAGNOSIS — J069 Acute upper respiratory infection, unspecified: Secondary | ICD-10-CM

## 2023-10-22 MED ORDER — METHYLPREDNISOLONE ACETATE 40 MG/ML IJ SUSP
40.0000 mg | Freq: Once | INTRAMUSCULAR | Status: AC
Start: 2023-10-22 — End: 2023-10-22
  Administered 2023-10-22: 40 mg via INTRAMUSCULAR

## 2023-10-22 MED ORDER — METHYLPREDNISOLONE ACETATE 40 MG/ML IJ SUSP
40.0000 mg | Freq: Once | INTRAMUSCULAR | Status: AC
  Administered 2023-10-22: 40 mg via INTRAMUSCULAR

## 2023-10-22 NOTE — Progress Notes (Signed)
 Patient ID: Manuel Wagner, male    DOB: 1970/11/02, 53 y.o.   MRN: [id number]  Chief Complaint  Patient presents with   Sinus Problem    Pt seen today for possible sinus infection/bronchitis that started 2 days ago; productive cough that's worse at night, has been taking hbp cold medicine;   Discussed the use of AI scribe software for clinical note transcription with the patient, who gave verbal consent to proceed.  History of Present Illness Manuel Wagner is a 53 year old male who presents with a productive chesty cough and concerns about traveling out of the country.  Productive cough and upper respiratory symptoms - Productive, chesty cough for several days, worse at night - Mucus present in the throat - Scratchy throat attributed to frequent coughing - No significant sinus congestion, wheezing, SOB, sore throat, ear pain, or lymphadenopathy  Recurrent respiratory infections - Typically experiences bronchitis or sinus infections 1-2 times per year - Last sinus infection occurred in February  Medication use for allergic and sinus symptoms - Used Flonase  nasal spray after sinus infection in Feb due to ear ringing - Completed a course of Zyrtec for approximately 1.5 months - Considering restarting nasal sprays and Zyrtec  Assessment & Plan Upper Respiratory Tract Infection Symptoms suggest viral infection with productive cough and mucus, no significant sinus or lung involvement. Discussed symptom management during travel. - Administered Depo-Medrol  80 mg to reduce inflammation and provide relief. - Advised restarting Flonase  nasal spray, one squirt each nostril twice daily x2d, then reduce to once daily. - Reassured antibiotics unnecessary as infection is viral. - Increase water intake to at least 2L daily - Encourage use of Zinc and Vitamin C daily while ill and traveling - Call back if no improvement after next week.      Subjective:    Outpatient  Medications Prior to Visit  Medication Sig Dispense Refill   amLODipine  (NORVASC ) 10 MG tablet TAKE 1 TABLET DAILY 90 tablet 3   losartan -hydrochlorothiazide (HYZAAR) 100-25 MG tablet TAKE 1 TABLET DAILY 90 tablet 3   rosuvastatin  (CRESTOR ) 20 MG tablet TAKE 1 TABLET DAILY 90 tablet 3   tadalafil  (CIALIS ) 20 MG tablet Take 0.5-1 tablets (10-20 mg total) by mouth every other day as needed for erectile dysfunction. 30 tablet 11   tirzepatide  (ZEPBOUND ) 10 MG/0.5ML Pen Inject 10 mg into the skin once a week. 2 mL 5   aspirin 81 MG chewable tablet Chew 81 mg by mouth daily.     esomeprazole  (NEXIUM ) 20 MG capsule Take 1 capsule (20 mg total) by mouth daily. (Patient not taking: Reported on 10/22/2023) 90 capsule 3   imiquimod  (ALDARA ) 5 % cream Apply a small amount to affected area once a day and cover with a bandage x 1 month 12 each 0   imiquimod  (ALDARA ) 5 % cream Apply 0.25 g topically at bedtime. (Patient not taking: Reported on 10/22/2023)     tretinoin  (RETIN-A ) 0.05 % cream Apply 1 application topically at bedtime. (Patient not taking: Reported on 10/22/2023)     chlorhexidine  (PERIDEX ) 0.12 % solution Dip toothbrush in solution and use to brush around inplants twice daily. 473 mL 12   triamcinolone  cream (KENALOG ) 0.1 % Apply 1 Application topically 2 (two) times daily. For 2 weeks, then as needed 30 g 0   No facility-administered medications prior to visit.   Past Medical History:  Diagnosis Date   Atypical nevus 2009   Back-Moderate (Exc) Derm in [place]  Atypical nevus 2019   Back Midline-Mild (Exc[place]   Colon polyp 12/16/2018   Colonoscopy 2013   Essential hypertension 12/16/2018   Family history of colon cancer 12/16/2018   Family history of premature CAD 06/20/2020   Father had bypass at 8 yo, 3 vessel   GERD (gastroesophageal reflux disease) 12/16/2018   Mixed hyperlipidemia 12/16/2018   OSA on CPAP 12/16/2018   Since 2012   Sleep apnea    cpap   Past Surgical History:  Procedure  Laterality Date   WISDOM TOOTH EXTRACTION     Allergies  Allergen Reactions   Lisinopril Cough and Other (See Comments)    cough      Objective:    Physical Exam Vitals and nursing note reviewed.  Constitutional:      General: He is not in acute distress.    Appearance: Normal appearance. He is not ill-appearing.  HENT:     Head: Normocephalic.     Right Ear: Tympanic membrane and ear canal normal.     Left Ear: Tympanic membrane and ear canal normal.     Nose:     Right Sinus: No maxillary sinus tenderness or frontal sinus tenderness.     Left Sinus: No maxillary sinus tenderness or frontal sinus tenderness.     Mouth/Throat:     Mouth: Mucous membranes are moist.     Pharynx: No pharyngeal swelling, oropharyngeal exudate, posterior oropharyngeal erythema or uvula swelling.     Tonsils: No tonsillar exudate or tonsillar abscesses.  Cardiovascular:     Rate and Rhythm: Normal rate and regular rhythm.  Pulmonary:     Effort: Pulmonary effort is normal.     Breath sounds: Normal breath sounds.  Musculoskeletal:        General: Normal range of motion.     Cervical back: Normal range of motion.  Lymphadenopathy:     Head:     Right side of head: No preauricular or posterior auricular adenopathy.     Left side of head: No preauricular or posterior auricular adenopathy.     Cervical: No cervical adenopathy.  Skin:    General: Skin is warm and dry.  Neurological:     Mental Status: He is alert and oriented to person, place, and time.  Psychiatric:        Mood and Affect: Mood normal.    BP 110/70 (BP Location: Right Arm, Patient Position: Sitting, Cuff Size: Normal)   Pulse 76   Temp (!) 97.5 F (36.4 C) (Temporal)   Ht 5' 11 (1.803 m)   Wt 235 lb 6.4 oz (106.8 kg)   SpO2 98%   BMI 32.83 kg/m  Wt Readings from Last 3 Encounters:  10/22/23 235 lb 6.4 oz (106.8 kg)  06/19/23 232 lb (105.2 kg)  02/18/23 236 lb 3.2 oz (107.1 kg)      Lucius Krabbe, NP

## 2023-10-23 ENCOUNTER — Ambulatory Visit: Admitting: Family Medicine

## 2023-11-04 ENCOUNTER — Other Ambulatory Visit: Payer: Self-pay

## 2023-11-04 ENCOUNTER — Encounter: Payer: Self-pay | Admitting: Family Medicine

## 2023-11-04 ENCOUNTER — Other Ambulatory Visit: Payer: Self-pay | Admitting: Family

## 2023-11-04 ENCOUNTER — Other Ambulatory Visit (HOSPITAL_COMMUNITY): Payer: Self-pay

## 2023-11-04 MED ORDER — ZEPBOUND 10 MG/0.5ML ~~LOC~~ SOAJ
10.0000 mg | SUBCUTANEOUS | 0 refills | Status: DC
  Filled 2023-11-04: qty 6, 84d supply, fill #0

## 2023-11-04 MED ORDER — ZEPBOUND 10 MG/0.5ML ~~LOC~~ SOAJ: 10.0000 mg | SUBCUTANEOUS | 0 refills | Status: DC

## 2023-11-11 ENCOUNTER — Other Ambulatory Visit: Payer: Self-pay

## 2023-11-11 ENCOUNTER — Other Ambulatory Visit (HOSPITAL_COMMUNITY): Payer: Self-pay

## 2023-11-11 MED ORDER — ZEPBOUND 10 MG/0.5ML ~~LOC~~ SOAJ
10.0000 mg | SUBCUTANEOUS | 0 refills | Status: DC
  Filled 2023-11-11: qty 6, 84d supply, fill #0

## 2024-01-27 ENCOUNTER — Ambulatory Visit (INDEPENDENT_AMBULATORY_CARE_PROVIDER_SITE_OTHER): Admitting: Family Medicine

## 2024-01-27 ENCOUNTER — Other Ambulatory Visit (HOSPITAL_COMMUNITY): Payer: Self-pay

## 2024-01-27 ENCOUNTER — Encounter: Payer: Self-pay | Admitting: Family Medicine

## 2024-01-27 VITALS — BP 110/74 | HR 72 | Temp 97.7°F | Ht 71.0 in | Wt 230.0 lb

## 2024-01-27 DIAGNOSIS — I10 Essential (primary) hypertension: Secondary | ICD-10-CM | POA: Diagnosis not present

## 2024-01-27 DIAGNOSIS — Z0001 Encounter for general adult medical examination with abnormal findings: Secondary | ICD-10-CM

## 2024-01-27 DIAGNOSIS — D369 Benign neoplasm, unspecified site: Secondary | ICD-10-CM | POA: Diagnosis not present

## 2024-01-27 DIAGNOSIS — Z23 Encounter for immunization: Secondary | ICD-10-CM

## 2024-01-27 DIAGNOSIS — G4733 Obstructive sleep apnea (adult) (pediatric): Secondary | ICD-10-CM

## 2024-01-27 DIAGNOSIS — E669 Obesity, unspecified: Secondary | ICD-10-CM

## 2024-01-27 DIAGNOSIS — Z8249 Family history of ischemic heart disease and other diseases of the circulatory system: Secondary | ICD-10-CM | POA: Diagnosis not present

## 2024-01-27 DIAGNOSIS — Z800 Family history of malignant neoplasm of digestive organs: Secondary | ICD-10-CM

## 2024-01-27 DIAGNOSIS — R361 Hematospermia: Secondary | ICD-10-CM | POA: Diagnosis not present

## 2024-01-27 DIAGNOSIS — K219 Gastro-esophageal reflux disease without esophagitis: Secondary | ICD-10-CM

## 2024-01-27 LAB — CBC WITH DIFFERENTIAL/PLATELET
Basophils Absolute: 0.1 K/uL (ref 0.0–0.1)
Basophils Relative: 0.9 % (ref 0.0–3.0)
Eosinophils Absolute: 0.1 K/uL (ref 0.0–0.7)
Eosinophils Relative: 1 % (ref 0.0–5.0)
HCT: 46.2 % (ref 39.0–52.0)
Hemoglobin: 16.2 g/dL (ref 13.0–17.0)
Lymphocytes Relative: 27.8 % (ref 12.0–46.0)
Lymphs Abs: 2.6 K/uL (ref 0.7–4.0)
MCHC: 35 g/dL (ref 30.0–36.0)
MCV: 82.6 fl (ref 78.0–100.0)
Monocytes Absolute: 0.4 K/uL (ref 0.1–1.0)
Monocytes Relative: 4.3 % (ref 3.0–12.0)
Neutro Abs: 6.1 K/uL (ref 1.4–7.7)
Neutrophils Relative %: 66 % (ref 43.0–77.0)
Platelets: 285 K/uL (ref 150.0–400.0)
RBC: 5.59 Mil/uL (ref 4.22–5.81)
RDW: 13.9 % (ref 11.5–15.5)
WBC: 9.2 K/uL (ref 4.0–10.5)

## 2024-01-27 LAB — COMPREHENSIVE METABOLIC PANEL WITH GFR
ALT: 13 U/L (ref 0–53)
AST: 15 U/L (ref 0–37)
Albumin: 4.9 g/dL (ref 3.5–5.2)
Alkaline Phosphatase: 57 U/L (ref 39–117)
BUN: 10 mg/dL (ref 6–23)
CO2: 24 meq/L (ref 19–32)
Calcium: 9.4 mg/dL (ref 8.4–10.5)
Chloride: 103 meq/L (ref 96–112)
Creatinine, Ser: 0.74 mg/dL (ref 0.40–1.50)
GFR: 103.83 mL/min (ref >60.00)
Glucose, Bld: 85 mg/dL (ref 70–99)
Potassium: 3.7 meq/L (ref 3.5–5.1)
Sodium: 137 meq/L (ref 135–145)
Total Bilirubin: 1.2 mg/dL (ref 0.2–1.2)
Total Protein: 7.4 g/dL (ref 6.0–8.3)

## 2024-01-27 LAB — LIPID PANEL
Cholesterol: 107 mg/dL (ref 0–200)
HDL: 45.4 mg/dL (ref >39.00)
LDL Cholesterol: 47 mg/dL (ref 0–99)
NonHDL: 61.48
Total CHOL/HDL Ratio: 2
Triglycerides: 73 mg/dL (ref 0.0–149.0)
VLDL: 14.6 mg/dL (ref 0.0–40.0)

## 2024-01-27 MED ORDER — ZEPBOUND 12.5 MG/0.5ML ~~LOC~~ SOAJ
12.5000 mg | SUBCUTANEOUS | 5 refills | Status: DC
  Filled 2024-01-27 – 2024-02-26 (×3): qty 2, 28d supply, fill #0

## 2024-01-27 NOTE — Addendum Note (Signed)
 Addended by: JODIE GAMMONS on: 01/27/2024 01:33 PM   Modules accepted: Orders

## 2024-01-27 NOTE — Progress Notes (Addendum)
 Subjective  Chief Complaint  Patient presents with   Annual Exam    Pt here for Annual Exam and is currently fasting     HPI: Manuel Wagner is a 53 y.o. male who presents to [facility] [place] today for a Male Wellness Visit. He also has the concerns and/or needs as listed above in the chief complaint. These will be addressed in addition to the Health Maintenance Visit.   Wellness Visit: annual visit with health maintenance review and exam  HM: due colonoscopy in December: + FH and h/o polyps. Exercising and eating well... still on weight loss program.  Eligible for prevnar 20 and flu shot today  Chronic disease f/u and/or acute problem visit: (deemed necessary to be done in addition to the wellness visit): Discussed the use of AI scribe software for clinical note transcription with the patient, who gave verbal consent to proceed.  History of Present Illness Manuel Wagner is a 53 year old male who presents for a follow-up regarding his medication and a recent episode of hematospermia.  Hematospermia - First episode of brownish discoloration in semen on Saturday night, described as 'a little blood in my sperm' - No associated hematuria - Event temporally associated with use of a massage chair and a strained bowel movement on the same day - No prior history of hematospermia - Concern for possible prostate pathology - No family history of prostate cancer - normal ejaculation. No back pain or dysuria.   Weight management - Weight loss has plateaued despite ongoing efforts. On zpebound 10. Down about 25 pounds.  - Practices intermittent fasting and follows a high-protein, low-carbohydrate diet - Engages in regular physical activity, including walking - Plans to initiate yoga and strength training - Not currently taking supplements but considering starting vitamin B12  Colorectal cancer screening - Last colonoscopy performed in December 2020 - +  adenomatous polyps and + FH.  GERD remains controlled on PPI chronically.  HTN Feeling well. Taking medications w/o adverse effects. No symptoms of CHF, angina; no palpitations, sob, cp or lower extremity edema. Compliant with meds.     Assessment  1. Encounter for well adult exam with abnormal findings   2. Need for influenza vaccination   3. Family history of premature CAD   4. Adenomatous polyp   5. Family history of colon cancer   6. Obesity (BMI 30-39.9)   7. OSA on CPAP   8. White coat syndrome with hypertension   9. Gastroesophageal reflux disease, unspecified whether esophagitis present   10. Need for pneumococcal 20-valent conjugate vaccination   11. Hematospermia      Plan  Male Wellness Visit: Age appropriate Health Maintenance and Prevention measures were discussed with patient. Included topics are cancer screening recommendations, ways to keep healthy (see AVS) including dietary and exercise recommendations, regular eye and dental care, use of seat belts, and avoidance of moderate alcohol use and tobacco use.  BMI: discussed patient's BMI and encouraged positive lifestyle modifications to help get to or maintain a target BMI. HM needs and immunizations were addressed and ordered. See below for orders. See HM and immunization section for updates. Routine labs and screening tests ordered including cmp, cbc and lipids where appropriate. Discussed recommendations regarding Vit D and calcium  supplementation (see AVS)  Chronic disease management visit and/or acute problem visit: Assessment and Plan Assessment & Plan Obesity Obesity management with current plateau in weight loss. He is practicing intermittent fasting and consuming a high-protein,  low-carb diet. Discussed the importance of adequate nutrition and potential metabolic effects of medication. Emphasized the need for adequate protein intake to prevent metabolic slowdown. - Increase dose of Zepbound  to 12.5mg   weekly - Encourage adequate protein intake and weight training exercise - Monitor nutritional status  Essential hypertension Blood pressure is well-controlled with current medication regimen. Check renal function and electrolytes.  GERD: continue PPI. Add vit b12   Adult Wellness Visit Routine adult wellness visit. Discussed dietary habits, exercise, and potential supplements. Blood pressure is well-controlled. Due for colonoscopy in December. - Administer pneumonia and flu vaccines - Encourage adequate hydration - Encourage strength training and yoga - Start B12 supplement  Hematospermia Reported hematospermia with brownish discoloration of sperm. Possible causes include recent trauma from massage chair and strained bowel movement. No blood in urine. Discussed that hematospermia is usually not serious and often resolves spontaneously. - Monitor for recurrence or persistence of hematospermia - check PSA   Follow up: 12 mo for cpe  Orders Placed This Encounter  Procedures   Flu vaccine trivalent PF, 6mos and older(Flulaval ,Afluria,Fluarix,Fluzone)   Pneumococcal conjugate vaccine 20-valent (Prevnar 20)   CBC with Differential/Platelet   Comprehensive metabolic panel with GFR   Lipid panel   TSH   PSA   No orders of the defined types were placed in this encounter.     Body mass index is 32.08 kg/m. Wt Readings from Last 3 Encounters:  01/27/24 230 lb (104.3 kg)  10/22/23 235 lb 6.4 oz (106.8 kg)  06/19/23 232 lb (105.2 kg)     Patient Active Problem List   Diagnosis Date Noted   Coronary artery disease involving native coronary artery of native heart without angina pectoris 06/20/2020    Priority: High    Dr. Hobart, cardiology: elevated calcium  score on CTA and mild CAD    Family history of premature CAD 06/20/2020    Priority: High    Father had bypass at 4 yo, 3 vessel    Family history of colon cancer 12/16/2018    Priority: High   Adenomatous polyp  12/16/2018    Priority: High    Colonoscopy 2020; repeat in 5 years. Dr. Luke Brass    Mixed hyperlipidemia 12/16/2018    Priority: High   White coat syndrome with hypertension 12/16/2018    Priority: High   OSA on CPAP 12/16/2018    Priority: High    Since 2012    Obesity (BMI 30-39.9) 12/16/2018    Priority: High    Started zepbound  07/2022    GERD (gastroesophageal reflux disease) 12/16/2018    Priority: Medium     EGD: gastritis and esophagitis 05/2020 Dr. Brass    Ganglion cyst of wrist, right 12/07/2021    Priority: Low   ED (erectile dysfunction) 12/16/2018    Priority: Low   Sensorineural hearing loss (SNHL) of left ear with unrestricted hearing of right ear 08/22/2023   Tinnitus of left ear 08/22/2023   Health Maintenance  Topic Date Due   Colonoscopy  03/18/2024   COVID-19 Vaccine (6 - 2025-26 season) 02/12/2024 (Originally 12/16/2023)   DTaP/Tdap/Td (3 - Td or Tdap) 10/20/2026   Pneumococcal Vaccine: 50+ Years  Completed   Influenza Vaccine  Completed   Hepatitis B Vaccines 19-59 Average Risk  Completed   Hepatitis C Screening  Completed   HIV Screening  Completed   Zoster Vaccines- Shingrix   Completed   HPV VACCINES  Aged Out   Meningococcal B Vaccine  Aged [employer]  History  Administered Date(s) Administered   Hepatitis A, Adult 01/20/2004, 07/07/2015   Hepatitis B, ADULT 08/09/2005, 09/11/2005, 03/05/2006   Influenza Split 03/05/2006, 04/03/2007, 01/07/2008, 03/25/2009, 01/27/2010, 01/17/2011, 03/20/2012   Influenza, Seasonal, Injecte, Preservative Fre 01/24/2023, 01/27/2024   Influenza,inj,Quad PF,6+ Mos 02/16/2018, 12/16/2018, 12/17/2019, 12/07/2021   Influenza,inj,quad, With Preservative 04/20/2013, 01/15/2014, 01/29/2015, 01/13/2016, 02/16/2018   PFIZER(Purple Top)SARS-COV-2 Vaccination 06/23/2019, 07/21/2019, 04/05/2020   PNEUMOCOCCAL CONJUGATE-20 01/27/2024   Pfizer Covid-19 Vaccine Bivalent Booster 73yrs & up 03/07/2021    Pfizer(Comirnaty )Fall Seasonal Vaccine 12 years and older 01/24/2023   Tdap 04/03/2007, 10/19/2016   Zoster Recombinant(Shingrix ) 12/07/2021, 05/16/2022   We updated and reviewed the patient's past history in detail and it is documented below. Allergies: Patient is allergic to lisinopril. Past Medical History Patient  has a past medical history of Atypical nevus (2009), Atypical nevus (2019), Colon polyp (12/16/2018), Essential hypertension (12/16/2018), Family history of colon cancer (12/16/2018), Family history of premature CAD (06/20/2020), GERD (gastroesophageal reflux disease) (12/16/2018), Mixed hyperlipidemia (12/16/2018), OSA on CPAP (12/16/2018), and Sleep apnea. Past Surgical History Patient  has a past surgical history that includes Wisdom tooth extraction. Family History: Patient family history includes Alcohol abuse in his brother, maternal grandfather, and paternal grandfather; Arthritis in his father; Colon cancer in his father; Hearing loss in his father; Heart disease in his father; Hyperlipidemia in his brother and mother; Hypertension in his brother and father. Social History:  Patient  reports that he has never smoked. He has never used smokeless tobacco. He reports current alcohol use. He reports that he does not use drugs.  Review of Systems: Constitutional: negative for fever or malaise Ophthalmic: negative for photophobia, double vision or loss of vision Cardiovascular: negative for chest pain, dyspnea on exertion, or new LE swelling Respiratory: negative for SOB or persistent cough Gastrointestinal: negative for abdominal pain, change in bowel habits or melena Genitourinary: negative for dysuria or gross hematuria, no abnormal uterine bleeding or disharge Musculoskeletal: negative for new gait disturbance or muscular weakness Integumentary: negative for new or persistent rashes, no breast lumps Neurological: negative for TIA or stroke symptoms Psychiatric: negative for SI or  delusions Allergic/Immunologic: negative for hives  Patient Care Team    Relationship Specialty Notifications Start End  Jodie Lavern CROME, MD PCP - General Family Medicine  12/16/18   Hobart Powell BRAVO, MD (Inactive) PCP - Cardiology Cardiology  03/28/20   Eda Iha, MD (Inactive) Consulting Physician Gastroenterology  03/30/19   Connee Nest, PA-C (Inactive)  Dermatology  09/24/19   Livingston Rigg, MD Consulting Physician Dermatology  04/13/20     Objective  Vitals: BP 110/74   Pulse 72   Temp 97.7 F (36.5 C)   Ht 5' 11 (1.803 m)   Wt 230 lb (104.3 kg)   SpO2 97%   BMI 32.08 kg/m  General:  Well developed, well nourished, no acute distress  Psych:  Alert and orientedx3,normal mood and affect HEENT:  Normocephalic, atraumatic, non-icteric sclera,  supple neck without adenopathy, mass or thyromegaly Cardiovascular:  Normal S1, S2, RRR without gallop, rub or murmur Respiratory:  Good breath sounds bilaterally, CTAB with normal respiratory effort Gastrointestinal: normal bowel sounds, soft, non-tender, no noted masses. No HSM MSK: extremities without edema, joints without erythema or swelling Neurologic:    Mental status is normal.  Gross motor and sensory exams are normal.  No tremor  Commons side effects, risks, benefits, and alternatives for medications and treatment plan prescribed today were discussed, and the patient expressed understanding of the given  instructions. Patient is instructed to call or message via MyChart if he/she has any questions or concerns regarding our treatment plan. No barriers to understanding were identified. We discussed Red Flag symptoms and signs in detail. Patient expressed understanding regarding what to do in case of urgent or emergency type symptoms.  Medication list was reconciled, printed and provided to the patient in AVS. Patient instructions and summary information was reviewed with the patient as documented in the AVS. This  note was prepared with assistance of Dragon voice recognition software. Occasional wrong-word or sound-a-like substitutions may have occurred due to the inherent limitations of voice recognition software

## 2024-01-27 NOTE — Patient Instructions (Signed)
 Please return in 12 months for your annual complete physical; please come fasting.   I will release your lab results to you on your MyChart account with further instructions. You may see the results before I do, but when I review them I will send you a message with my report or have my assistant call you if things need to be discussed. Please reply to my message with any questions. Thank you!   If you have any questions or concerns, please don't hesitate to send me a message via MyChart or call the office at [phone]. Thank you for visiting with us  today! It's our pleasure caring for you.    VISIT SUMMARY: Today, you came in for a follow-up regarding your medication and a recent episode of hematospermia (blood in semen). We also discussed your weight management, blood pressure, and general wellness, including your colorectal cancer screening.  YOUR PLAN: -HEMATOSPERMIA: Hematospermia is the presence of blood in semen. It can be caused by various factors, including trauma or strain. In your case, it may be related to the use of a massage chair and a strained bowel movement. This condition is usually not serious and often resolves on its own. We will monitor for any recurrence or persistence.  -OBESITY: Obesity is a condition characterized by excessive body fat. Your weight loss has plateaued despite your efforts with intermittent fasting and a high-protein, low-carb diet. We discussed the importance of adequate nutrition and protein intake to prevent metabolic slowdown. We will increase your dose of Zepbound  and continue to monitor your nutritional status.  -ESSENTIAL HYPERTENSION: Essential hypertension is high blood pressure with no identifiable cause. Your blood pressure is well-controlled with your current medication regimen. No changes are needed at this time.  -ADULT WELLNESS VISIT: During your routine adult wellness visit, we discussed your diet, exercise, and potential supplements. Your  blood pressure is well-controlled. You are due for a colonoscopy in December. We administered pneumonia and flu vaccines, encouraged adequate hydration, and recommended starting a B12 supplement. We also discussed incorporating strength training and yoga into your routine.  INSTRUCTIONS: Please monitor for any recurrence or persistence of hematospermia. Continue with your current diet and exercise regimen, and ensure adequate protein intake. We have increased your dose of Zepbound . Start taking a B12 supplement as discussed. You are due for a colonoscopy in December. If you experience any new symptoms or have concerns, please schedule a follow-up appointment.                      Contains text generated by [employer]bridge.                                 Contains text generated by [employer]bridge.

## 2024-01-28 LAB — TSH: TSH: 1.75 u[IU]/mL (ref 0.35–5.50)

## 2024-01-28 LAB — PSA: PSA: 2.12 ng/mL (ref 0.10–4.00)

## 2024-01-31 ENCOUNTER — Ambulatory Visit: Payer: Self-pay | Admitting: Family Medicine

## 2024-01-31 NOTE — Progress Notes (Signed)
 Labs reviewed.  The ASCVD Risk score (Arnett DK, et al., 2019) failed to calculate for the following reasons:   The valid total cholesterol range is 130 to 320 mg/dL  Dear Manuel Wagner, Thank you for allowing me to care for you at your recent office visit.  I wanted to let you know that I have reviewed your lab test results and am happy to report that they are all normal.  Everything looks good!   Sincerely, Dr. Jodie

## 2024-02-03 ENCOUNTER — Other Ambulatory Visit (HOSPITAL_COMMUNITY): Payer: Self-pay

## 2024-02-03 ENCOUNTER — Encounter: Payer: Self-pay | Admitting: Internal Medicine

## 2024-02-06 ENCOUNTER — Other Ambulatory Visit (HOSPITAL_COMMUNITY): Payer: Self-pay

## 2024-02-12 ENCOUNTER — Encounter: Payer: Self-pay | Admitting: Family Medicine

## 2024-02-13 ENCOUNTER — Telehealth: Payer: Self-pay

## 2024-02-13 ENCOUNTER — Other Ambulatory Visit (HOSPITAL_COMMUNITY): Payer: Self-pay

## 2024-02-13 NOTE — Telephone Encounter (Signed)
 Pharmacy Patient Advocate Encounter   Received notification from Patient Advice Request messages that prior authorization for Zepbound  12.5MG /0.5ML pen-injectors is required/requested.   Insurance verification completed.   The patient is insured through [employer].   Per test claim: PA required; PA submitted to above mentioned insurance via Latent Key/confirmation #/EOC [id number] Status is pending

## 2024-02-13 NOTE — Telephone Encounter (Signed)
 Noted

## 2024-02-17 NOTE — Telephone Encounter (Signed)
 Pharmacy Patient Advocate Encounter  Received notification from [employer] that Prior Authorization for Zepbound  12.5MG /0.5ML pen-injectors  has been APPROVED from 02/13/24 to 02/15/25   PA #/Case ID/Reference #: [id number]

## 2024-02-26 ENCOUNTER — Other Ambulatory Visit (HOSPITAL_COMMUNITY): Payer: Self-pay

## 2024-02-26 ENCOUNTER — Other Ambulatory Visit (HOSPITAL_BASED_OUTPATIENT_CLINIC_OR_DEPARTMENT_OTHER): Payer: Self-pay

## 2024-03-05 ENCOUNTER — Ambulatory Visit: Admitting: Internal Medicine

## 2024-03-05 ENCOUNTER — Other Ambulatory Visit: Payer: Self-pay

## 2024-03-05 MED ORDER — ZEPBOUND 12.5 MG/0.5ML ~~LOC~~ SOAJ: 12.5000 mg | SUBCUTANEOUS | 5 refills | Status: DC

## 2024-03-16 ENCOUNTER — Encounter: Payer: Self-pay | Admitting: Internal Medicine

## 2024-03-16 ENCOUNTER — Other Ambulatory Visit (HOSPITAL_COMMUNITY): Payer: Self-pay

## 2024-03-16 ENCOUNTER — Ambulatory Visit (AMBULATORY_SURGERY_CENTER)

## 2024-03-16 VITALS — Ht 71.0 in | Wt 233.2 lb

## 2024-03-16 DIAGNOSIS — Z860100 Personal history of colon polyps, unspecified: Secondary | ICD-10-CM

## 2024-03-16 DIAGNOSIS — Z800 Family history of malignant neoplasm of digestive organs: Secondary | ICD-10-CM

## 2024-03-16 MED ORDER — NA SULFATE-K SULFATE-MG SULF 17.5-3.13-1.6 GM/177ML PO SOLN
1.0000 | Freq: Once | ORAL | 0 refills | Status: AC
  Filled 2024-03-16 – 2024-04-03 (×2): qty 354, 1d supply, fill #0

## 2024-03-16 NOTE — Progress Notes (Signed)
 No egg or soy allergy known to patient  No issues known to pt with past sedation with any surgeries or procedures Patient denies ever being told they had issues or difficulty with intubation  No FH of Malignant Hyperthermia  Pt is on diet pills, Takes Zepbound  and 7 day hold instructions provided  Pt is not on  home 02  Pt is not on blood thinners  Pt denies issues with constipation  No A fib or A flutter Have any cardiac testing pending--Routine cardiology appt on 12/26 Pt can ambulate  Pt denies use of chewing tobacco Discussed diabetic I weight loss medication holds Discussed NSAID holds Checked BMI Pt instructed to use Singlecare.com or GoodRx for a price reduction on prep  Patient's chart reviewed by Manuel Wagner CNRA prior to previsit and patient appropriate for the [facility].  Pre visit completed and red dot placed by patient's name on their procedure day (on provider's schedule).

## 2024-03-17 ENCOUNTER — Other Ambulatory Visit: Payer: Self-pay

## 2024-03-17 MED ORDER — ZEPBOUND 12.5 MG/0.5ML ~~LOC~~ SOAJ: 12.5000 mg | SUBCUTANEOUS | 5 refills | Status: AC

## 2024-03-26 ENCOUNTER — Other Ambulatory Visit (HOSPITAL_COMMUNITY): Payer: Self-pay

## 2024-03-30 ENCOUNTER — Encounter: Admitting: Internal Medicine

## 2024-04-03 ENCOUNTER — Other Ambulatory Visit (HOSPITAL_COMMUNITY): Payer: Self-pay

## 2024-04-10 ENCOUNTER — Ambulatory Visit: Admitting: Internal Medicine

## 2024-04-13 ENCOUNTER — Encounter: Payer: Self-pay | Admitting: Internal Medicine

## 2024-04-13 ENCOUNTER — Encounter: Admitting: Internal Medicine

## 2024-04-17 ENCOUNTER — Encounter: Payer: Self-pay | Admitting: Internal Medicine

## 2024-04-17 ENCOUNTER — Ambulatory Visit: Admitting: Internal Medicine

## 2024-04-17 VITALS — BP 107/71 | HR 62 | Temp 98.2°F | Resp 13 | Ht 71.0 in | Wt 233.2 lb

## 2024-04-17 DIAGNOSIS — D128 Benign neoplasm of rectum: Secondary | ICD-10-CM

## 2024-04-17 DIAGNOSIS — K5730 Diverticulosis of large intestine without perforation or abscess without bleeding: Secondary | ICD-10-CM

## 2024-04-17 DIAGNOSIS — K648 Other hemorrhoids: Secondary | ICD-10-CM

## 2024-04-17 DIAGNOSIS — Z800 Family history of malignant neoplasm of digestive organs: Secondary | ICD-10-CM | POA: Diagnosis not present

## 2024-04-17 DIAGNOSIS — Z860101 Personal history of adenomatous and serrated colon polyps: Secondary | ICD-10-CM | POA: Diagnosis not present

## 2024-04-17 DIAGNOSIS — Z1211 Encounter for screening for malignant neoplasm of colon: Secondary | ICD-10-CM | POA: Diagnosis present

## 2024-04-17 DIAGNOSIS — Z860100 Personal history of colon polyps, unspecified: Secondary | ICD-10-CM

## 2024-04-17 MED ORDER — SODIUM CHLORIDE 0.9 % IV SOLN: 500.0000 mL | Freq: Once | INTRAVENOUS | Status: DC

## 2024-04-17 NOTE — Progress Notes (Signed)
 Sedate, gd SR, tolerated procedure well, VSS, report to RN

## 2024-04-17 NOTE — Patient Instructions (Addendum)

## 2024-04-17 NOTE — Op Note (Signed)
 [facility] Patient Name: Manuel Wagner Procedure Date: 04/17/2024 7:38 AM MRN: [id number] Endoscopist: Norleen SAILOR. Abran , MD, [id number] Age: 54 Referring MD:  Date of Birth: 12/08/1970 Gender: Male Account #: [id number] Procedure:                Colonoscopy with cold snare polypectomy x 1 Indications:              High risk colon cancer surveillance: Personal                            history of adenomas previous examinations 2007,                            2013, 2020 (Dr. Eda). Family history of colon                            cancer in father around age 32 Medicines:                Monitored Anesthesia Care Procedure:                Pre-Anesthesia Assessment:                           - Prior to the procedure, a History and Physical                            was performed, and patient medications and                            allergies were reviewed. The patient's tolerance of                            previous anesthesia was also reviewed. The risks                            and benefits of the procedure and the sedation                            options and risks were discussed with the patient.                            All questions were answered, and informed consent                            was obtained. Prior Anticoagulants: The patient has                            taken no anticoagulant or antiplatelet agents.                            After reviewing the risks and benefits, the patient                            was deemed in satisfactory condition to undergo the  procedure.                           After obtaining informed consent, the colonoscope                            was passed under direct vision. Throughout the                            procedure, the patient's blood pressure, pulse, and                            oxygen saturations were monitored continuously. The                            Olympus Scope SN  [id number] was introduced through the                            anus and advanced to the the cecum, identified by                            appendiceal orifice and ileocecal valve. The                            ileocecal valve, appendiceal orifice, and rectum                            were photographed. The quality of the bowel                            preparation was excellent. The colonoscopy was                            performed without difficulty. The patient tolerated                            the procedure well. The bowel preparation used was                            SUPREP via split dose instruction. Scope In: 8:37:00 AM Scope Out: 8:50:06 AM Scope Withdrawal Time: 0 hours 9 minutes 10 seconds  Total Procedure Duration: 0 hours 13 minutes 6 seconds  Findings:                 A 4 mm polyp was found in the rectum. The polyp was                            sessile. The polyp was removed with a cold snare.                            Resection and retrieval were complete.                           Many diverticula were found in the entire colon.  Internal hemorrhoids were found during retroflexion.                           The exam was otherwise without abnormality on                            direct and retroflexion views. Complications:            No immediate complications. Estimated blood loss:                            None. Estimated Blood Loss:     Estimated blood loss: none. Impression:               - One 4 mm polyp in the rectum, removed with a cold                            snare. Resected and retrieved.                           - Diverticulosis in the entire examined colon.                           - Internal hemorrhoids.                           - The examination was otherwise normal on direct                            and retroflexion views. Recommendation:           - Repeat colonoscopy in 5 years for surveillance.                            - Patient has a contact number available for                            emergencies. The signs and symptoms of potential                            delayed complications were discussed with the                            patient. Return to normal activities tomorrow.                            Written discharge instructions were provided to the                            patient.                           - Resume previous diet.                           - Continue present medications.                           -  Await pathology results. Norleen SAILOR. Abran, MD 04/17/2024 9:12:20 AM This report has been signed electronically.

## 2024-04-17 NOTE — Progress Notes (Signed)
 HISTORY OF PRESENT ILLNESS:  Manuel Wagner is a 54 y.o. male with a family history of colon cancer in his father and a personal history of colon polyps.  Presents today for surveillance colonoscopy  REVIEW OF SYSTEMS:  All non-GI ROS negative except for  Past Medical History:  Diagnosis Date   Atypical nevus 2009   Back-Moderate (Exc) Derm in [place]   Atypical nevus 2019   Back Midline[place]Mild (Exc[place]   Colon polyp 12/16/2018   Colonoscopy 2013   Essential hypertension 12/16/2018   Family history of colon cancer 12/16/2018   Family history of premature CAD 06/20/2020   Father had bypass at 40 yo, 3 vessel   GERD (gastroesophageal reflux disease) 12/16/2018   Mixed hyperlipidemia 12/16/2018   OSA on CPAP 12/16/2018   Since 2012   Sleep apnea    cpap    Past Surgical History:  Procedure Laterality Date   WISDOM TOOTH EXTRACTION      Social History Manuel Wagner  reports that he has never smoked. He has never used smokeless tobacco. He reports current alcohol use. He reports that he does not use drugs.  family history includes Alcohol abuse in his brother, maternal grandfather, and paternal grandfather; Arthritis in his father; Colon cancer in his father; Hearing loss in his father; Heart disease in his father; Hyperlipidemia in his brother and mother; Hypertension in his brother and father.  Allergies[1]     PHYSICAL EXAMINATION: Vital signs: BP 126/77   Pulse 65   Temp 98.2 F (36.8 C)   Resp 13   Ht 5' 11 (1.803 m)   Wt 233 lb 3.2 oz (105.8 kg)   SpO2 100%   BMI 32.52 kg/m  General: Well-developed, well-nourished, no acute distress HEENT: Sclerae are anicteric, conjunctiva pink. Oral mucosa intact Lungs: Clear Heart: Regular Abdomen: soft, nontender, nondistended, no obvious ascites, no peritoneal signs, normal bowel sounds. No organomegaly. Extremities: No edema Psychiatric: alert and oriented x3. Cooperative     ASSESSMENT:  Family history of colon cancer and  personal history of colon polyps   PLAN:  Surveillance colonoscopy         [1]  Allergies Allergen Reactions   Lisinopril Other (See Comments) and Cough    cough

## 2024-04-20 ENCOUNTER — Telehealth: Payer: Self-pay | Admitting: *Deleted

## 2024-04-20 NOTE — Telephone Encounter (Signed)
 No answer on follow up call. Left message.

## 2024-04-21 ENCOUNTER — Ambulatory Visit: Payer: Self-pay | Admitting: Internal Medicine

## 2024-04-21 LAB — SURGICAL PATHOLOGY

## 2024-04-28 ENCOUNTER — Encounter: Payer: Self-pay | Admitting: Family Medicine

## 2024-04-28 ENCOUNTER — Other Ambulatory Visit: Payer: Self-pay

## 2024-04-28 DIAGNOSIS — H9319 Tinnitus, unspecified ear: Secondary | ICD-10-CM

## 2024-04-28 NOTE — Telephone Encounter (Signed)
 Referral placed.

## 2024-05-07 ENCOUNTER — Encounter: Payer: Self-pay | Admitting: Family Medicine

## 2024-05-11 ENCOUNTER — Ambulatory Visit: Admitting: Internal Medicine

## 2024-05-22 ENCOUNTER — Encounter: Payer: Self-pay | Admitting: Family Medicine

## 2024-05-26 ENCOUNTER — Institutional Professional Consult (permissible substitution) (INDEPENDENT_AMBULATORY_CARE_PROVIDER_SITE_OTHER): Admitting: Physician Assistant

## 2024-06-12 ENCOUNTER — Ambulatory Visit: Admitting: Internal Medicine
